# Patient Record
Sex: Female | Born: 1995
Health system: Southern US, Community
[De-identification: ages and names within clinical notes are randomized; demographics above are authoritative.]

## PROBLEM LIST (undated history)

## (undated) ENCOUNTER — Inpatient Hospital Stay (HOSPITAL_COMMUNITY): Payer: Self-pay

## (undated) DIAGNOSIS — O24419 Gestational diabetes mellitus in pregnancy, unspecified control: Secondary | ICD-10-CM

## (undated) HISTORY — DX: Gestational diabetes mellitus in pregnancy, unspecified control: O24.419

---
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
## 2017-03-15 ENCOUNTER — Encounter (HOSPITAL_COMMUNITY)
Admission: AD | Disposition: A | Discharge status: Home / Self Care | Payer: Self-pay | Source: Ambulatory Visit | Attending: Emergency Medicine

## 2017-03-15 ENCOUNTER — Encounter (HOSPITAL_COMMUNITY): Payer: Self-pay | Admitting: *Deleted

## 2017-03-15 ENCOUNTER — Observation Stay (HOSPITAL_COMMUNITY)
Admission: AD | Admit: 2017-03-15 | Discharge: 2017-03-16 | Disposition: A | Discharge status: Home / Self Care | Payer: BLUE CROSS/BLUE SHIELD | Source: Ambulatory Visit | Attending: Obstetrics and Gynecology | Admitting: Obstetrics and Gynecology

## 2017-03-15 ENCOUNTER — Other Ambulatory Visit: Payer: Self-pay

## 2017-03-15 DIAGNOSIS — A419 Sepsis, unspecified organism: Secondary | ICD-10-CM

## 2017-03-15 DIAGNOSIS — D649 Anemia, unspecified: Secondary | ICD-10-CM | POA: Diagnosis present

## 2017-03-15 DIAGNOSIS — Z789 Other specified health status: Secondary | ICD-10-CM | POA: Diagnosis present

## 2017-03-15 DIAGNOSIS — N939 Abnormal uterine and vaginal bleeding, unspecified: Secondary | ICD-10-CM | POA: Diagnosis present

## 2017-03-15 DIAGNOSIS — O039 Complete or unspecified spontaneous abortion without complication: Secondary | ICD-10-CM | POA: Diagnosis present

## 2017-03-15 DIAGNOSIS — O0387 Sepsis following complete or unspecified spontaneous abortion: Secondary | ICD-10-CM

## 2017-03-15 DIAGNOSIS — O99019 Anemia complicating pregnancy, unspecified trimester: Secondary | ICD-10-CM | POA: Diagnosis not present

## 2017-03-15 DIAGNOSIS — Z3A10 10 weeks gestation of pregnancy: Secondary | ICD-10-CM

## 2017-03-15 DIAGNOSIS — Z758 Other problems related to medical facilities and other health care: Secondary | ICD-10-CM | POA: Diagnosis present

## 2017-03-15 HISTORY — DX: Sepsis following complete or unspecified spontaneous abortion: O03.87

## 2017-03-15 HISTORY — PX: DILATION AND CURETTAGE OF UTERUS: SHX78

## 2017-03-15 LAB — CBC
HCT: 12 % — ABNORMAL LOW (ref 36.0–46.0)
Hemoglobin: 3.9 g/dL — CL (ref 12.0–15.0)
MCH: 25.7 pg — ABNORMAL LOW (ref 26.0–34.0)
MCHC: 32.5 g/dL (ref 30.0–36.0)
MCV: 78.9 fL (ref 78.0–100.0)
Platelets: 232 K/uL (ref 150–400)
RBC: 1.52 MIL/uL — ABNORMAL LOW (ref 3.87–5.11)
RDW: 14.6 % (ref 11.5–15.5)
WBC: 10.9 K/uL — ABNORMAL HIGH (ref 4.0–10.5)

## 2017-03-15 LAB — COMPREHENSIVE METABOLIC PANEL WITH GFR
ALT: 7 U/L — ABNORMAL LOW (ref 14–54)
AST: 18 U/L (ref 15–41)
Albumin: 2.9 g/dL — ABNORMAL LOW (ref 3.5–5.0)
Alkaline Phosphatase: 44 U/L (ref 38–126)
Anion gap: 10 (ref 5–15)
BUN: <5 mg/dL — ABNORMAL LOW (ref 6–20)
CO2: 20 mmol/L — ABNORMAL LOW (ref 22–32)
Calcium: 8 mg/dL — ABNORMAL LOW (ref 8.9–10.3)
Chloride: 107 mmol/L (ref 101–111)
Creatinine, Ser: 0.54 mg/dL (ref 0.44–1.00)
GFR calc Af Amer: >60 mL/min
GFR calc non Af Amer: >60 mL/min
Glucose, Bld: 109 mg/dL — ABNORMAL HIGH (ref 65–99)
Potassium: 3.5 mmol/L (ref 3.5–5.1)
Sodium: 137 mmol/L (ref 135–145)
Total Bilirubin: 0.2 mg/dL — ABNORMAL LOW (ref 0.3–1.2)
Total Protein: 5.3 g/dL — ABNORMAL LOW (ref 6.5–8.1)

## 2017-03-15 LAB — PREPARE RBC (CROSSMATCH)

## 2017-03-15 LAB — I-STAT BETA HCG BLOOD, ED (MC, WL, AP ONLY)

## 2017-03-15 LAB — CBG MONITORING, ED: Glucose-Capillary: 104 mg/dL — ABNORMAL HIGH (ref 65–99)

## 2017-03-15 LAB — I-STAT CG4 LACTIC ACID, ED: Lactic Acid, Venous: 2.21 mmol/L (ref 0.5–1.9)

## 2017-03-15 LAB — HCG, QUANTITATIVE, PREGNANCY: hCG, Beta Chain, Quant, S: 14531 m[IU]/mL — ABNORMAL HIGH

## 2017-03-15 LAB — TSH: TSH: 2.187 u[IU]/mL (ref 0.350–4.500)

## 2017-03-15 SURGERY — DILATION AND CURETTAGE
Anesthesia: General

## 2017-03-15 MED ORDER — SODIUM CHLORIDE 0.9 % IV SOLN: 10.0000 mL/h | Freq: Once | INTRAVENOUS | Status: DC

## 2017-03-15 MED ORDER — FENTANYL CITRATE (PF) 100 MCG/2ML IJ SOLN
INTRAMUSCULAR | Status: AC
  Filled 2017-03-15: qty 2

## 2017-03-15 MED ORDER — LABETALOL HCL 5 MG/ML IV SOLN: 10.0000 mg | Freq: Once | INTRAVENOUS | Status: DC

## 2017-03-15 MED ORDER — SODIUM CHLORIDE 0.9 % IV BOLUS (SEPSIS)
1000.0000 mL | Freq: Once | INTRAVENOUS | Status: AC
  Administered 2017-03-15: 1000 mL via INTRAVENOUS

## 2017-03-15 MED ORDER — VANCOMYCIN HCL IN DEXTROSE 1-5 GM/200ML-% IV SOLN
1000.0000 mg | Freq: Once | INTRAVENOUS | Status: AC
  Administered 2017-03-15: 1000 mg via INTRAVENOUS

## 2017-03-15 MED ORDER — ETOMIDATE 2 MG/ML IV SOLN
INTRAVENOUS | Status: AC
  Filled 2017-03-15: qty 10

## 2017-03-15 MED ORDER — ACETAMINOPHEN 325 MG RE SUPP
325.0000 mg | Freq: Once | RECTAL | Status: AC
  Administered 2017-03-15: 325 mg via RECTAL

## 2017-03-15 MED ORDER — MIDAZOLAM HCL 2 MG/2ML IJ SOLN
INTRAMUSCULAR | Status: AC
  Filled 2017-03-15: qty 2

## 2017-03-15 MED ORDER — SODIUM CHLORIDE 0.9 % IV SOLN
100.0000 mg | Freq: Once | INTRAVENOUS | Status: DC
  Filled 2017-03-15: qty 100

## 2017-03-15 MED ORDER — SOD CITRATE-CITRIC ACID 500-334 MG/5ML PO SOLN
30.0000 mL | Freq: Once | ORAL | Status: DC
  Filled 2017-03-15: qty 15

## 2017-03-15 MED ORDER — DIPHENHYDRAMINE HCL 50 MG/ML IJ SOLN
12.5000 mg | Freq: Once | INTRAMUSCULAR | Status: AC
  Administered 2017-03-15: 12.5 mg via INTRAVENOUS
  Filled 2017-03-15: qty 1

## 2017-03-15 MED ORDER — PROCHLORPERAZINE EDISYLATE 5 MG/ML IJ SOLN
10.0000 mg | Freq: Once | INTRAMUSCULAR | Status: AC
  Administered 2017-03-15: 10 mg via INTRAVENOUS
  Filled 2017-03-15: qty 2

## 2017-03-15 MED ORDER — ACETAMINOPHEN 650 MG RE SUPP
650.0000 mg | Freq: Once | RECTAL | Status: AC
Start: 2017-03-15 — End: 2017-03-15
  Administered 2017-03-15: 650 mg via RECTAL

## 2017-03-15 MED ORDER — SOD CITRATE-CITRIC ACID 500-334 MG/5ML PO SOLN
30.0000 mL | Freq: Once | ORAL | Status: AC
  Administered 2017-03-15: 30 mL via ORAL

## 2017-03-15 MED ORDER — PIPERACILLIN-TAZOBACTAM 3.375 G IVPB 30 MIN
3.3750 g | Freq: Once | INTRAVENOUS | Status: AC
  Administered 2017-03-15: 3.375 g via INTRAVENOUS

## 2017-03-15 MED ORDER — SODIUM CHLORIDE 0.9 % IV SOLN
10.0000 mL/h | Freq: Once | INTRAVENOUS | Status: AC
  Administered 2017-03-16: via INTRAVENOUS

## 2017-03-15 MED ORDER — SUCCINYLCHOLINE CHLORIDE 200 MG/10ML IV SOSY
PREFILLED_SYRINGE | INTRAVENOUS | Status: AC
  Filled 2017-03-15: qty 10

## 2017-03-15 SURGICAL SUPPLY — 18 items
CATH ROBINSON RED A/P 16FR (CATHETERS) ×3 IMPLANT
CNTNR SPEC C3OZ STD GRAD LEK (MISCELLANEOUS) ×1 IMPLANT
CONT SPEC 3OZ W/LID STRL (MISCELLANEOUS) ×2
GLOVE BIOGEL PI IND STRL 7.0 (GLOVE) ×1 IMPLANT
GLOVE BIOGEL PI IND STRL 7.5 (GLOVE) ×1 IMPLANT
GLOVE BIOGEL PI INDICATOR 7.0 (GLOVE) ×2
GLOVE BIOGEL PI INDICATOR 7.5 (GLOVE) ×2
GLOVE SURG SS PI 7.0 STRL IVOR (GLOVE) ×3 IMPLANT
GOWN STRL REUS W/TWL LRG LVL3 (GOWN DISPOSABLE) ×3 IMPLANT
GOWN STRL REUS W/TWL XL LVL3 (GOWN DISPOSABLE) ×3 IMPLANT
KIT BERKELEY 1ST TRIMESTER 3/8 (MISCELLANEOUS) ×3 IMPLANT
NS IRRIG 1000ML POUR BTL (IV SOLUTION) ×3 IMPLANT
PACK VAGINAL MINOR WOMEN LF (CUSTOM PROCEDURE TRAY) ×3 IMPLANT
PAD OB MATERNITY 4.3X12.25 (PERSONAL CARE ITEMS) ×3 IMPLANT
PAD PREP 24X48 CUFFED NSTRL (MISCELLANEOUS) ×3 IMPLANT
SET BERKELEY SUCTION TUBING (SUCTIONS) ×3 IMPLANT
TOWEL OR 17X24 6PK STRL BLUE (TOWEL DISPOSABLE) ×6 IMPLANT
VACURETTE 8 RIGID CVD (CANNULA) ×3 IMPLANT

## 2017-03-15 NOTE — ED Provider Notes (Signed)
I saw and evaluated the patient, reviewed the resident's note and I agree with the findings and plan.  Pertinent History: The patient is a 22 year old female who presents with lower abdominal discomfort with heavy vaginal bleeding and now having tachycardia and light headedness - sx are severe, worsened today.  Pertinent Exam findings: The patient is very tachycardic to 135, she is pale, she has pale mucous membranes, she has mild tenderness in the lower abdomen but no guarding.  Per the resident exam there is large amount of vaginal bleeding in the vaginal vault, there is products of conception in the cervix, the patient is unstable appearing  The patient has a critical illness with severe anemia with ongoing vaginal bleeding and needs an emergent OB/GYN consultation.  [facility] with blood transfusion hanging, she has a type and screen, Rh, IV fluid resuscitation, 2 large-bore IVs  Prior to d/c the pt had developed a fever - this is likely from sepsis from the spontaneous abortion with retained products / endometritis  I spoke with Dr. Vergie LivingPickens - who has accepted the pt and states he will prep the OR - the blood has been started as emergency release and will be transfused on a rapid transfuser - broad spectrum antibiotics will be started while pt is being transferred - not to delay transfer any longer as she will need D and C and further rescucitation  2 units given on rapid infuser prior to transfer.  BP improved slightly as did heart rate. Tylenol ordered prior to transfer  Vitals:   03/15/17 2256 03/15/17 2300 03/15/17 2306 03/15/17 2310  BP: (!) 93/38 (!) 99/48 (!) 102/52   Pulse: (!) 140 (!) 132 (!) 139   Resp: (!) 26 (!) 22 (!) 26   Temp:    (!) 102.1 F (38.9 C)  TempSrc:    Oral  SpO2: 100% 100% 98%      .Critical Care Performed by: Eber HongMiller, Clover Feehan, MD Authorized by: Eber HongMiller, Vitaliy Eisenhour, MD   Critical care provider statement:    Critical care time  (minutes):  35   Critical care time was exclusive of:  Separately billable procedures and treating other patients and teaching time   Critical care was necessary to treat or prevent imminent or life-threatening deterioration of the following conditions: hemorrhagic crisis.   Critical care was time spent personally by me on the following activities:  Blood draw for specimens, development of treatment plan with patient or surrogate, discussions with consultants, evaluation of patient's response to treatment, examination of patient, obtaining history from patient or surrogate, ordering and performing treatments and interventions, ordering and review of laboratory studies, ordering and review of radiographic studies, pulse oximetry, re-evaluation of patient's condition and review of old charts     EKG Interpretation  Date/Time:  Monday March 15 2017 19:30:48 EST Ventricular Rate:  154 PR Interval:  134 QRS Duration: 62 QT Interval:  322 QTC Calculation: 515 R Axis:   78 Text Interpretation:  Sinus tachycardia Otherwise normal ECG No old tracing to compare Confirmed by Eber HongMiller, Dewey Neukam ([id number]) on 03/15/2017 11:05:30 PM       I personally interpreted the EKG as well as the resident and agree with the interpretation on the resident's chart.  Final diagnoses:  Severe anemia  Spontaneous abortion  Vaginal hemorrhage  Sepsis, due to unspecified organism [facility](HCC)      Eber HongMiller, Breona Cherubin, MD 03/16/17 [phone]

## 2017-03-15 NOTE — ED Notes (Signed)
Lab stating they are processing labwork

## 2017-03-15 NOTE — ED Notes (Signed)
This RN with another pt in the middle of procedure unable to answer lab phone call but followed up immediately when able to to get lactic results.

## 2017-03-15 NOTE — MAU Note (Signed)
[facility] with vaginal bleeding.

## 2017-03-15 NOTE — H&P (Addendum)
Obstetrics & Gynecology H&P   Date of Admission: 03/15/2017   Primary OBGYN: None  Reason for Admission: septic AB  History of Present Illness: Kristin Jackson is a 22 y.o. G2P1001 (Patient's last menstrual period was 03/14/2017.), with the above CC. Patient transferred from [facility] ED for anemia, VB, septic AB.  Patient received two units PRBC and 1L IVF and transferred to [facility]. Patient unsure of NPO status.   ROS: A 12-point review of systems was performed and negative, except as stated in the above HPI.  OBGYN History: As per HPI. OB History  Gravida Para Term Preterm AB Living  2 1 1  0 0 1  SAB TAB Ectopic Multiple Live Births  0 0 0 0 1    # Outcome Date GA Lbr Len/2nd Weight Sex Delivery Anes PTL Lv  2 Current           1 Term      Vag-Spont          Past Medical History: History reviewed. No pertinent past medical history.  Past Surgical History: History reviewed. No pertinent surgical history.  Family History:  No family history on file.  Social History:  Social History   Socioeconomic History  . Marital status: Unknown    Spouse name: Not on file  . Number of children: Not on file  . Years of education: Not on file  . Highest education level: Not on file  Social Needs  . Financial resource strain: Not on file  . Food insecurity - worry: Not on file  . Food insecurity - inability: Not on file  . Transportation needs - medical: Not on file  . Transportation needs - non-medical: Not on file  Occupational History  . Not on file  Tobacco Use  . Smoking status: Never Smoker  . Smokeless tobacco: Never Used  Substance and Sexual Activity  . Alcohol use: No    Frequency: Never  . Drug use: No  . Sexual activity: Not on file  Other Topics Concern  . Not on file  Social History Narrative  . Not on file    Allergy: No Known Allergies  Current Outpatient Medications: No medications prior to admission.     Hospital Medications: Current Facility-Administered  Medications  Medication Dose Route Frequency Provider Last Rate Last Dose  . 0.9 %  sodium chloride infusion  10 mL/hr Intravenous Once Eber Hong, MD      . 0.9 %  sodium chloride infusion  10 mL/hr Intravenous Once Eber Hong, MD      . sodium citrate-citric acid (ORACIT) solution 30 mL  30 mL Oral Once [place] Bing, MD      . vancomycin (VANCOCIN) IVPB 1000 mg/200 mL premix  1,000 mg Intravenous Once Eber Hong, MD 200 mL/hr at 03/15/17 2306 1,000 mg at 03/15/17 2306     Physical Exam:  Current Vital Signs 24h Vital Sign Ranges  T (!) 102.1 F (38.9 C) Temp  Avg: 101.2 F (38.4 C)  Min: 99.3 F (37.4 C)  Max: 102.1 F (38.9 C)  BP 95/66 BP  Min: 93/38  Max: 122/59  HR (!) 137 Pulse  Avg: 141.7  Min: 132  Max: 165  RR 20 Resp  Avg: 23.7  Min: 14  Max: 33  SaO2 100 % Not Delivered SpO2  Avg: 99.8 %  Min: 98 %  Max: 100 %       24 Hour I/O Current Shift I/O  Time Ins Outs No intake/output data  recorded. 02/25 1901 - 02/26 0700 In: 1000  Out: -    Patient Vitals for the past 24 hrs:  BP Temp Temp src Pulse Resp SpO2  03/15/17 2337 95/66 - - (!) 137 - 100 %  03/15/17 2334 119/66 (!) 102.1 F (38.9 C) - (!) 135 20 100 %  03/15/17 2310 - (!) 102.1 F (38.9 C) Oral - - -  03/15/17 2306 (!) 102/52 - - (!) 139 (!) 26 98 %  03/15/17 2300 (!) 99/48 - - (!) 132 (!) 22 100 %  03/15/17 2256 (!) 93/38 - - (!) 140 (!) 26 100 %  03/15/17 2239 (!) 99/41 - - (!) 140 (!) 28 100 %  03/15/17 2200 103/69 - - (!) 149 (!) 25 100 %  03/15/17 2130 (!) 114/56 - - - 14 -  03/15/17 2128 - - - (!) 137 (!) 21 100 %  03/15/17 2100 (!) 122/59 - - (!) 143 (!) 26 100 %  03/15/17 2030 109/62 - - - (!) 33 -  03/15/17 1926 (!) 109/52 99.3 F (37.4 C) Oral (!) 165 20 100 %    General appearance: Well nourished, well developed female in no acute distress.  Neck:  Supple, normal appearance, and no thyromegaly  Cardiovascular: S1, S2 normal, no murmur, rub or gallop, regular rate and  rhythm Respiratory:  Clear to auscultation bilateral. Normal respiratory effort Abdomen: positive bowel sounds and no masses, hernias; diffusely non tender to palpation, non distended Neuro/Psych:  Normal mood and affect.  Skin:  Warm and dry.  Extremities: no clubbing, cyanosis, or edema.   Pelvic exam in ED showed VB in vault with POCs at the cervix.   Laboratory: Beta HCG: 14531 Recent Labs  Lab 03/15/17 1940  WBC 10.9*  HGB 3.9*  HCT 12.0*  PLT 232   Recent Labs  Lab 03/15/17 1940  NA 137  K 3.5  CL 107  CO2 20*  BUN <5*  CREATININE 0.54  CALCIUM 8.0*  PROT 5.3*  BILITOT 0.2*  ALKPHOS 44  ALT 7*  AST 18  GLUCOSE 109*   No results for input(s): APTT, INR, PTT in the last 168 hours.  Invalid input(s): DRHAPTT Recent Labs  Lab 03/15/17 2242  ABORH O POS  O POS Performed at [facility], [address] [place], [place] [address]     Imaging:  none  Assessment: Kristin Jackson is a 22 y.o. G2P1001 (Patient's last menstrual period was 03/14/2017.) who presented to the ED with complaints of VB, anemia; findings are consistent with septic AB.  Plan: D/w pt and consented for suction D&C Patient received vanc and zosyn at around 2300. Will give pre op doxy NPO. Pt for OR. New T&S ordered  Phone interpreter used.   Cornelia Copaharlie Sekai Gitlin, Jr. MD Attending Center for [facility](Faculty Practice)

## 2017-03-15 NOTE — ED Provider Notes (Signed)
THE [facility] MATERNITY ADMISSIONS Provider Note   CSN: [id number] Arrival date & time: 03/15/17  1901     History   Chief Complaint Chief Complaint  Patient presents with  . Dizziness  . Headache    HPI Kristin Jackson is a 22 y.o. female.  HPI  22 year old female presents emergency department with heavy vaginal bleeding over the past week and a half that is different from her baseline monthly menstrual cycle.  Patient denies any sexual activity or prior pregnancy.  Patient denies any recent illness/trauma.  Patient endorses lightheadedness, sensation of rapid heart rate but otherwise denies any shortness of breath.  Swahili/African interpreter used  History reviewed. No pertinent past medical history.  There are no active problems to display for this patient.   History reviewed. No pertinent surgical history.  OB History    No data available       Home Medications    Prior to Admission medications   Not on File    Family History No family history on file.  Social History Social History   Tobacco Use  . Smoking status: Never Smoker  . Smokeless tobacco: Never Used  Substance Use Topics  . Alcohol use: No    Frequency: Never  . Drug use: No     Allergies   Patient has no known allergies.   Review of Systems Review of Systems  Review of Systems  Constitutional: Negative for fever and chills.  HENT: Negative for ear pain, sore throat and trouble swallowing.   Eyes: Negative for pain and visual disturbance.  Respiratory: Negative for cough and shortness of breath.   Cardiovascular: see hPI Gastrointestinal: Negative for nausea, vomiting, abdominal pain and diarrhea.  Genitourinary: see HPI Musculoskeletal: Negative for back pain and joint swelling.  Skin: Negative for rash and wound.  Neurological: Negative for dizziness, syncope, speech difficulty, weakness and numbness.   Physical Exam Updated Vital Signs BP (!)  102/52   Pulse (!) 139   Temp (!) 102.1 F (38.9 C) (Oral)   Resp (!) 26   LMP 03/14/2017   SpO2 98%   Physical Exam   Physical Exam Vitals:   03/15/17 2306 03/15/17 2310  BP: (!) 102/52   Pulse: (!) 139   Resp: (!) 26   Temp:  (!) 102.1 F (38.9 C)  SpO2: 98%    Constitutional: Patient is in no acute distress Head: Normocephalic and atraumatic.  Eyes: Extraocular motion intact, no scleral icterus Neck: Supple without meningismus, mass, or overt JVD Respiratory: Effort normal and breath sounds normal. No respiratory distress. CV: Tachycardia no obvious murmurs.  Pulses +2 and symmetric Abdomen: Soft, non-tender, non-distended GU: Negative external lesions; vaginal canal with gross blood pooling in the posterior cervix with suspected products of conception within the cervix. MSK: Extremities are atraumatic without deformity, ROM intact Skin: Pale/cool/diaphoretic Neuro: Alert and oriented, no motor deficit noted Psychiatric: Mood and affect are normal.   ED Treatments / Results  Labs (all labs ordered are listed, but only abnormal results are displayed) Labs Reviewed  CBC - Abnormal; Notable for the following components:      Result Value   WBC 10.9 (*)    RBC 1.52 (*)    Hemoglobin 3.9 (*)    HCT 12.0 (*)    MCH 25.7 (*)    All other components within normal limits  HCG, QUANTITATIVE, PREGNANCY - Abnormal; Notable for the following components:   hCG, Beta Chain, Quant, S 14,531 (*)  All other components within normal limits  COMPREHENSIVE METABOLIC PANEL - Abnormal; Notable for the following components:   CO2 20 (*)    Glucose, Bld 109 (*)    BUN <5 (*)    Calcium 8.0 (*)    Total Protein 5.3 (*)    Albumin 2.9 (*)    ALT 7 (*)    Total Bilirubin 0.2 (*)    All other components within normal limits  CBG MONITORING, ED - Abnormal; Notable for the following components:   Glucose-Capillary 104 (*)    All other components within normal limits  I-STAT CG4  LACTIC ACID, ED - Abnormal; Notable for the following components:   Lactic Acid, Venous 2.21 (*)    All other components within normal limits  I-STAT BETA HCG BLOOD, ED (MC, [facility], AP ONLY) - Abnormal; Notable for the following components:   I-stat hCG, quantitative >2,000.0 (*)    All other components within normal limits  CULTURE, BLOOD (ROUTINE X 2)  CULTURE, BLOOD (ROUTINE X 2)  TSH  URINALYSIS, ROUTINE W REFLEX MICROSCOPIC  RAPID URINE DRUG SCREEN, HOSP PERFORMED  PREPARE RBC (CROSSMATCH)  TYPE AND SCREEN  ABO/RH  PREPARE RBC (CROSSMATCH)  TYPE AND SCREEN    EKG  EKG Interpretation  Date/Time:  Monday March 15 2017 19:30:48 EST Ventricular Rate:  154 PR Interval:  134 QRS Duration: 62 QT Interval:  322 QTC Calculation: 515 R Axis:   78 Text Interpretation:  Sinus tachycardia Otherwise normal ECG No old tracing to compare Confirmed by Eber HongMiller, Brian ([id number]) on 03/15/2017 11:05:30 PM       Radiology No results found.  Procedures Procedures (including critical care time)  Medications Ordered in ED Medications  0.9 %  sodium chloride infusion (not administered)  vancomycin (VANCOCIN) IVPB 1000 mg/200 mL premix (1,000 mg Intravenous Transfusing/Transfer 03/15/17 2324)  piperacillin-tazobactam (ZOSYN) IVPB 3.375 g (3.375 g Intravenous Transfusing/Transfer 03/15/17 2324)  0.9 %  sodium chloride infusion (not administered)  sodium citrate-citric acid (ORACIT) solution 30 mL (not administered)  sodium chloride 0.9 % bolus 1,000 mL (0 mLs Intravenous Stopped 03/15/17 2300)  prochlorperazine (COMPAZINE) injection 10 mg (10 mg Intravenous Given 03/15/17 2049)  diphenhydrAMINE (BENADRYL) injection 12.5 mg (12.5 mg Intravenous Given 03/15/17 2050)  acetaminophen (TYLENOL) suppository 650 mg (650 mg Rectal Given 03/15/17 2315)  acetaminophen (TYLENOL) suppository 325 mg (325 mg Rectal Given 03/15/17 2315)     Initial Impression / Assessment and Plan / ED Course  I have reviewed  the triage vital signs and the nursing notes.  Pertinent labs & imaging results that were available during my care of the patient were reviewed by me and considered in my medical decision making (see chart for details).     22 year old female presents emergency department with heavy vaginal bleeding over the past week and a half that is different from her baseline monthly menstrual cycle.  Patient denies any sexual activity or prior pregnancy.  Patient denies any recent illness/trauma.  Patient endorses lightheadedness, sensation of rapid heart rate but otherwise denies any shortness of breath.  Swahili/African interpreter used     upon arrival the patient was tachycardic 160s with systolic within the 100's otherwise no acute distress.  Physical exam as annotated above hCG was positive at 14,000.  Patient is O+.  Hemoglobin was found to be at 3.92 units of emergency O- blood was released and rapidly transfused prior to transfer.  During the course of the emergency department encounter patient became febrile.  Blood cultures  were collected and patient was started empirically on Vanco mycin/Zosyn with concern for septic abortion/endometritis.  Spoke with Dr. Ashok Cordia from [facility] health [facility] for emergency transfer to the operating room.  Patient is stable for transfer.    Final Clinical Impressions(s) / ED Diagnoses   Final diagnoses:  Severe anemia  Spontaneous abortion  Vaginal hemorrhage  Sepsis, due to unspecified organism [facility])    ED Discharge Orders    None       Jaynie Collins, DO 03/15/17 2334    Eber Hong, MD 03/16/17 1409

## 2017-03-15 NOTE — ED Triage Notes (Addendum)
Pt has been having dizziness and frontal headache since Friday. Denies NVD. Tachycardi, appears pale in triage.

## 2017-03-16 ENCOUNTER — Inpatient Hospital Stay (HOSPITAL_COMMUNITY): Payer: BLUE CROSS/BLUE SHIELD | Admitting: Anesthesiology

## 2017-03-16 ENCOUNTER — Encounter (HOSPITAL_COMMUNITY): Payer: Self-pay | Admitting: Obstetrics and Gynecology

## 2017-03-16 ENCOUNTER — Inpatient Hospital Stay (HOSPITAL_COMMUNITY): Payer: BLUE CROSS/BLUE SHIELD

## 2017-03-16 ENCOUNTER — Other Ambulatory Visit: Payer: Self-pay

## 2017-03-16 DIAGNOSIS — O0387 Sepsis following complete or unspecified spontaneous abortion: Secondary | ICD-10-CM | POA: Diagnosis not present

## 2017-03-16 LAB — CBC WITH DIFFERENTIAL/PLATELET
BASOS PCT: 0 %
Basophils Absolute: 0 10*3/uL (ref 0.0–0.1)
Eosinophils Absolute: 0 10*3/uL (ref 0.0–0.7)
Eosinophils Relative: 0 %
HEMATOCRIT: 28.2 % — AB (ref 36.0–46.0)
Hemoglobin: 10 g/dL — ABNORMAL LOW (ref 12.0–15.0)
LYMPHS ABS: 0.8 10*3/uL (ref 0.7–4.0)
LYMPHS PCT: 7 %
MCH: 30.3 pg (ref 26.0–34.0)
MCHC: 35.5 g/dL (ref 30.0–36.0)
MCV: 85.5 fL (ref 78.0–100.0)
MONO ABS: 0.1 10*3/uL (ref 0.1–1.0)
MONOS PCT: 1 %
NEUTROS ABS: 12 10*3/uL — AB (ref 1.7–7.7)
NEUTROS PCT: 92 %
Platelets: 157 10*3/uL (ref 150–400)
RBC: 3.3 MIL/uL — ABNORMAL LOW (ref 3.87–5.11)
RDW: 16 % — AB (ref 11.5–15.5)
WBC: 13 10*3/uL — ABNORMAL HIGH (ref 4.0–10.5)

## 2017-03-16 LAB — TYPE AND SCREEN
ABO/RH(D): O POS
ANTIBODY SCREEN: NEGATIVE
UNIT DIVISION: 0
Unit division: 0
Unit division: 0
Unit division: 0

## 2017-03-16 LAB — COMPREHENSIVE METABOLIC PANEL
ALBUMIN: 2.6 g/dL — AB (ref 3.5–5.0)
ALK PHOS: 44 U/L (ref 38–126)
ALT: 10 U/L — ABNORMAL LOW (ref 14–54)
ANION GAP: 6 (ref 5–15)
AST: 26 U/L (ref 15–41)
BUN: <5 mg/dL — ABNORMAL LOW (ref 6–20)
CHLORIDE: 112 mmol/L — AB (ref 101–111)
CO2: 20 mmol/L — AB (ref 22–32)
Calcium: 6.6 mg/dL — ABNORMAL LOW (ref 8.9–10.3)
Creatinine, Ser: 0.41 mg/dL — ABNORMAL LOW (ref 0.44–1.00)
GFR calc non Af Amer: >60 mL/min (ref >60)
GLUCOSE: 136 mg/dL — AB (ref 65–99)
POTASSIUM: 3.7 mmol/L (ref 3.5–5.1)
SODIUM: 138 mmol/L (ref 135–145)
Total Bilirubin: 0.9 mg/dL (ref 0.3–1.2)
Total Protein: 5.3 g/dL — ABNORMAL LOW (ref 6.5–8.1)

## 2017-03-16 LAB — BPAM RBC
BLOOD PRODUCT EXPIRATION DATE: 201903152359
BLOOD PRODUCT EXPIRATION DATE: 201903272359
Blood Product Expiration Date: 201903152359
Blood Product Expiration Date: 201903182359
ISSUE DATE / TIME: 201902252240
ISSUE DATE / TIME: 201902252240
UNIT TYPE AND RH: 9500
Unit Type and Rh: 5100
Unit Type and Rh: 5100
Unit Type and Rh: 9500

## 2017-03-16 LAB — POCT I-STAT, CHEM 8
BUN: <3 mg/dL — ABNORMAL LOW (ref 6–20)
CREATININE: 0.4 mg/dL — AB (ref 0.44–1.00)
Calcium, Ion: 1.02 mmol/L — ABNORMAL LOW (ref 1.15–1.40)
Chloride: 108 mmol/L (ref 101–111)
GLUCOSE: 124 mg/dL — AB (ref 65–99)
HEMATOCRIT: 17 % — AB (ref 36.0–46.0)
HEMOGLOBIN: 5.8 g/dL — AB (ref 12.0–15.0)
POTASSIUM: 4.5 mmol/L (ref 3.5–5.1)
Sodium: 141 mmol/L (ref 135–145)
TCO2: 19 mmol/L — AB (ref 22–32)

## 2017-03-16 LAB — ABO/RH
ABO/RH(D): O POS
ABO/RH(D): O POS

## 2017-03-16 LAB — PREPARE RBC (CROSSMATCH)

## 2017-03-16 MED ORDER — POLYETHYLENE GLYCOL 3350 17 G PO PACK: 17.0000 g | PACK | Freq: Every day | ORAL | Status: DC | PRN

## 2017-03-16 MED ORDER — IBUPROFEN 600 MG PO TABS: 600.0000 mg | ORAL_TABLET | Freq: Four times a day (QID) | ORAL | 0 refills | Status: DC | PRN

## 2017-03-16 MED ORDER — DEXAMETHASONE SODIUM PHOSPHATE 4 MG/ML IJ SOLN
INTRAMUSCULAR | Status: DC | PRN
  Administered 2017-03-16: 4 mg via INTRAVENOUS

## 2017-03-16 MED ORDER — SCOPOLAMINE 1 MG/3DAYS TD PT72
MEDICATED_PATCH | TRANSDERMAL | Status: DC | PRN
  Administered 2017-03-16: 1 via TRANSDERMAL

## 2017-03-16 MED ORDER — IBUPROFEN 600 MG PO TABS: 600.0000 mg | ORAL_TABLET | Freq: Four times a day (QID) | ORAL | Status: DC | PRN

## 2017-03-16 MED ORDER — MIDAZOLAM HCL 2 MG/2ML IJ SOLN
INTRAMUSCULAR | Status: DC | PRN
  Administered 2017-03-16: 2 mg via INTRAVENOUS

## 2017-03-16 MED ORDER — FERROUS GLUCONATE 324 (38 FE) MG PO TABS: 324.0000 mg | ORAL_TABLET | Freq: Two times a day (BID) | ORAL | 3 refills | Status: DC

## 2017-03-16 MED ORDER — LACTATED RINGERS IV SOLN
INTRAVENOUS | Status: DC | PRN
  Administered 2017-03-16: via INTRAVENOUS

## 2017-03-16 MED ORDER — SODIUM CHLORIDE 0.9 % IV SOLN
INTRAVENOUS | Status: DC | PRN
  Administered 2017-03-16: 100 mg via INTRAVENOUS

## 2017-03-16 MED ORDER — DEXAMETHASONE SODIUM PHOSPHATE 4 MG/ML IJ SOLN
INTRAMUSCULAR | Status: AC
  Filled 2017-03-16: qty 1

## 2017-03-16 MED ORDER — SODIUM CHLORIDE 0.9 % IV SOLN
INTRAVENOUS | Status: DC
  Administered 2017-03-16: 06:00:00 via INTRAVENOUS

## 2017-03-16 MED ORDER — SCOPOLAMINE 1 MG/3DAYS TD PT72
MEDICATED_PATCH | TRANSDERMAL | Status: AC
  Filled 2017-03-16: qty 1

## 2017-03-16 MED ORDER — ETOMIDATE 2 MG/ML IV SOLN
INTRAVENOUS | Status: DC | PRN
  Administered 2017-03-16: 10 mg via INTRAVENOUS

## 2017-03-16 MED ORDER — PROMETHAZINE HCL 25 MG/ML IJ SOLN: 6.2500 mg | INTRAMUSCULAR | Status: DC | PRN

## 2017-03-16 MED ORDER — ROCURONIUM BROMIDE 100 MG/10ML IV SOLN
INTRAVENOUS | Status: DC | PRN
  Administered 2017-03-16: 30 mg via INTRAVENOUS

## 2017-03-16 MED ORDER — SUCCINYLCHOLINE CHLORIDE 20 MG/ML IJ SOLN
INTRAMUSCULAR | Status: DC | PRN
  Administered 2017-03-16: 100 mg via INTRAVENOUS

## 2017-03-16 MED ORDER — ONDANSETRON HCL 4 MG/2ML IJ SOLN: 4.0000 mg | Freq: Four times a day (QID) | INTRAMUSCULAR | Status: DC | PRN

## 2017-03-16 MED ORDER — ONDANSETRON HCL 4 MG PO TABS: 4.0000 mg | ORAL_TABLET | Freq: Four times a day (QID) | ORAL | Status: DC | PRN

## 2017-03-16 MED ORDER — LACTATED RINGERS IV SOLN
INTRAVENOUS | Status: DC
  Administered 2017-03-16: 04:00:00 via INTRAVENOUS

## 2017-03-16 MED ORDER — DOXYCYCLINE HYCLATE 100 MG PO CAPS: 100.0000 mg | ORAL_CAPSULE | Freq: Two times a day (BID) | ORAL | 0 refills | Status: DC

## 2017-03-16 MED ORDER — OXYCODONE HCL 5 MG PO TABS: 5.0000 mg | ORAL_TABLET | Freq: Four times a day (QID) | ORAL | Status: DC | PRN

## 2017-03-16 MED ORDER — FENTANYL CITRATE (PF) 100 MCG/2ML IJ SOLN
INTRAMUSCULAR | Status: DC | PRN
  Administered 2017-03-16: 50 ug via INTRAVENOUS

## 2017-03-16 MED ORDER — SUGAMMADEX SODIUM 200 MG/2ML IV SOLN
INTRAVENOUS | Status: AC
  Filled 2017-03-16: qty 2

## 2017-03-16 MED ORDER — HYDROMORPHONE HCL 1 MG/ML IJ SOLN: 0.2500 mg | INTRAMUSCULAR | Status: DC | PRN

## 2017-03-16 MED ORDER — FERROUS GLUCONATE 324 (38 FE) MG PO TABS: 324.0000 mg | ORAL_TABLET | Freq: Two times a day (BID) | ORAL | Status: DC

## 2017-03-16 MED ORDER — SODIUM CHLORIDE 0.9 % IV SOLN
Freq: Once | INTRAVENOUS | Status: AC
  Administered 2017-03-16: 01:00:00 via INTRAVENOUS

## 2017-03-16 MED ORDER — SUGAMMADEX SODIUM 200 MG/2ML IV SOLN
INTRAVENOUS | Status: DC | PRN
  Administered 2017-03-16: 200 mg via INTRAVENOUS

## 2017-03-16 MED ORDER — ONDANSETRON HCL 4 MG/2ML IJ SOLN
INTRAMUSCULAR | Status: DC | PRN
  Administered 2017-03-16: 4 mg via INTRAVENOUS

## 2017-03-16 NOTE — Anesthesia Postprocedure Evaluation (Signed)
Anesthesia Post Note  Patient: Kristin Jackson  Procedure(s) Performed: DILATATION AND CURETTAGE (Suction D & C with Ultrasound) (N/A )     Patient location during evaluation: PACU Anesthesia Type: General Level of consciousness: awake and alert Pain management: pain level controlled Vital Signs Assessment: post-procedure vital signs reviewed and stable Respiratory status: spontaneous breathing, nonlabored ventilation, respiratory function stable and patient connected to nasal cannula oxygen Cardiovascular status: blood pressure returned to baseline and stable Postop Assessment: no apparent nausea or vomiting Anesthetic complications: no    Last Vitals:  Vitals:   03/16/17 0433 03/16/17 0500  BP: (!) 109/58   Pulse: (!) 117   Resp: 18   Temp: 37.3 C   SpO2: 100% 100%    Last Pain:  Vitals:   03/16/17 0500  TempSrc:   PainSc: 0-No pain   Pain Goal:                 Kennieth RadFitzgerald, Yaziel Brandon E

## 2017-03-16 NOTE — Anesthesia Preprocedure Evaluation (Signed)
Anesthesia Evaluation  Patient identified by MRN, date of birth, ID band Patient awake    Reviewed: Allergy & Precautions, NPO status , Patient's Chart, lab work & pertinent test resultsPreop documentation limited or incomplete due to emergent nature of procedure.  Airway Mallampati: II  TM Distance: >3 FB Neck ROM: Full    Dental  (+) Dental Advisory Given   Pulmonary neg pulmonary ROS,    breath sounds clear to auscultation       Cardiovascular negative cardio ROS   Rhythm:Regular Rate:Normal     Neuro/Psych negative neurological ROS     GI/Hepatic negative GI ROS, Neg liver ROS,   Endo/Other  negative endocrine ROS  Renal/GU negative Renal ROS     Musculoskeletal   Abdominal   Peds  Hematology  (+) anemia , Acute blood loss anemia   Anesthesia Other Findings   Reproductive/Obstetrics                             Lab Results  Component Value Date   WBC 10.9 (H) 03/15/2017   HGB 3.9 (LL) 03/15/2017   HCT 12.0 (L) 03/15/2017   MCV 78.9 03/15/2017   PLT 232 03/15/2017   Lab Results  Component Value Date   CREATININE 0.54 03/15/2017   BUN <5 (L) 03/15/2017   NA 137 03/15/2017   K 3.5 03/15/2017   CL 107 03/15/2017   CO2 20 (L) 03/15/2017    Anesthesia Physical Anesthesia Plan  ASA: IV and emergent  Anesthesia Plan: General   Post-op Pain Management:    Induction: Intravenous and Rapid sequence  PONV Risk Score and Plan: 3 and Ondansetron, Dexamethasone, Midazolam and Treatment may vary due to age or medical condition  Airway Management Planned: Oral ETT  Additional Equipment:   Intra-op Plan:   Post-operative Plan: Extubation in OR and Possible Post-op intubation/ventilation  Informed Consent: I have reviewed the patients History and Physical, chart, labs and discussed the procedure including the risks, benefits and alternatives for the proposed anesthesia with the  patient or authorized representative who has indicated his/her understanding and acceptance.   Dental advisory given  Plan Discussed with: CRNA  Anesthesia Plan Comments:         Anesthesia Quick Evaluation

## 2017-03-16 NOTE — Transfer of Care (Signed)
Immediate Anesthesia Transfer of Care Note  Patient: Kristin Jackson  Procedure(s) Performed: DILATATION AND CURETTAGE (Suction D & C with Ultrasound) (N/A )  Patient Location: PACU  Anesthesia Type:General  Level of Consciousness: awake  Airway & Oxygen Therapy: Patient Spontanous Breathing and Patient connected to nasal cannula oxygen  Post-op Assessment: Report given to RN and Post -op Vital signs reviewed and stable  Post vital signs: Reviewed and stable  Last Vitals:  Vitals:   03/15/17 2334 03/15/17 2337  BP: 119/66 95/66  Pulse: (!) 135 (!) 137  Resp: 20   Temp: (!) 38.9 C   SpO2: 100% 100%    Last Pain:  Vitals:   03/15/17 2354  TempSrc:   PainSc: 0-No pain         Complications: No apparent anesthesia complications

## 2017-03-16 NOTE — Discharge Instructions (Signed)

## 2017-03-16 NOTE — Progress Notes (Addendum)
Ipad interpreter uses for assessment purpose. Pt reports no problems at this time. Pt also able to give her breakfast order through interpreter. Carmelina DaneERRI L Orris Perin, RN   Interpreter ID [id number]

## 2017-03-16 NOTE — Discharge Summary (Signed)
Physician Discharge Summary  Patient ID: Kristin Jackson MRN: [id number] DOB/AGE: 21/01/1995 21 y.o.  Admit date: 03/15/2017 Discharge date: 03/16/2017  Admission Diagnoses: septic AB  Discharge Diagnoses:  Active Problems:   Language barrier   Spontaneous abortion with septicemia   Discharged Condition: good  Hospital Course: Pt was admitted with septic AB. She is s/p D&E. Patient had an uncomplicated surgery; for further details of this surgery, please refer to the operative note. Furthermore, the patient had an uncomplicated postoperative course.  By time of discharge, her pain was controlled on oral pain medications; she was ambulating, voiding without difficulty, tolerating regular diet and passing flatus.  She was deemed stable for discharge to home.    Significant Diagnostic Studies: labs: CBC and radiology: Ultrasound: pelvis  Treatments: surgery: D&C  Discharge Exam: Blood pressure 113/67, pulse (!) 104, temperature 98.6 F (37 C), temperature source Oral, resp. rate 18, last menstrual period 03/14/2017, SpO2 100 %. General appearance: alert and no distress GI: soft, non-tender; bowel sounds normal; no masses,  no organomegaly Extremities: extremities normal, atraumatic, no cyanosis or edema  CBC    Component Value Date/Time   WBC 13.0 (H) 03/16/2017 0705   RBC 3.30 (L) 03/16/2017 0705   HGB 10.0 (L) 03/16/2017 0705   HCT 28.2 (L) 03/16/2017 0705   PLT 157 03/16/2017 0705   MCV 85.5 03/16/2017 0705   MCH 30.3 03/16/2017 0705   MCHC 35.5 03/16/2017 0705   RDW 16.0 (H) 03/16/2017 0705   LYMPHSABS 0.8 03/16/2017 0705   MONOABS 0.1 03/16/2017 0705   EOSABS 0.0 03/16/2017 0705   BASOSABS 0.0 03/16/2017 0705    Disposition: Final discharge disposition not confirmed  Discharge Instructions    Call MD for:  difficulty breathing, headache or visual disturbances   Complete by:  As directed    Call MD for:  persistant dizziness or light-headedness   Complete by:  As  directed    Call MD for:  persistant nausea and vomiting   Complete by:  As directed    Call MD for:  redness, tenderness, or signs of infection (pain, swelling, redness, odor or green/yellow discharge around incision site)   Complete by:  As directed    Call MD for:  severe uncontrolled pain   Complete by:  As directed    Call MD for:  temperature >100.4   Complete by:  As directed    Diet - low sodium heart healthy   Complete by:  As directed    Increase activity slowly   Complete by:  As directed      Allergies as of 03/16/2017   No Known Allergies     Medication List    TAKE these medications   doxycycline 100 MG capsule Commonly known as:  VIBRAMYCIN Take 1 capsule (100 mg total) by mouth 2 (two) times daily.   ferrous gluconate 324 MG tablet Commonly known as:  FERGON Take 1 tablet (324 mg total) by mouth 2 (two) times daily with a meal. Start taking on:  03/17/2017   ibuprofen 600 MG tablet Commonly known as:  ADVIL,MOTRIN Take 1 tablet (600 mg total) by mouth every 6 (six) hours as needed (mild pain).      Follow-up Information    Theodore BingPickens, Charlie, MD Follow up in 2 week(s).   Specialty:  Obstetrics and Gynecology Contact information: [address] [place] [address] [phone]           Signed: Willodean RosenthalCarolyn Harraway-Smith 03/16/2017, 11:02 AM

## 2017-03-16 NOTE — Op Note (Signed)
Operative Note   03/16/2017  PRE-OP DIAGNOSIS: Septic abortion. Retained products of conception   POST-OP DIAGNOSIS: Same  SURGEON: Surgeon(s) and Role:    * Wauseon BingPickens, Haiden Clucas, MD - Primary  ASSISTANT: None  PROCEDURE:  Suction dilation and curettage with ultrasound guidance  ANESTHESIA: Monitor Anesthesia Care   ESTIMATED BLOOD LOSS: 100mL  DRAINS: indwelling bladder foley placed at end of case  TOTAL IV FLUIDS: per anesthesia note  SPECIMENS: products of conception to pathology  VTE PROPHYLAXIS: SCDs to the bilateral lower extremities  ANTIBIOTICS: Doxycycline 100mg  IV x 1 given intra operatively after vancomycin  COMPLICATIONS: none  DISPOSITION: PACU - hemodynamically stable.  CONDITION: stable  BLOOD TYPE: O POS. Rhogam given:not applicable  FINDINGS: Exam under anesthesia revealed 8-10 week sized uterus with no masses and bilateral adnexa without masses or fullness. Moderate amounts of products of conception were seen, with gritty texture in all four quadrants at the end of the case and noted on ultrasound: endometrial stripe 9mm and no blood flow.   PROCEDURE IN DETAIL:  After informed consent was obtained, the patient was taken to the operating room where anesthesia was obtained without difficulty. The patient was positioned in the dorsal lithotomy position in [place] stirrups. The patient was examined under anesthesia, with the above noted findings.  The bi-valved speculum was placed inside the patient's vagina, and the the anterior lip of the cervix was seen and grasped with the tenaculum.  The suction was then calibrated to 60mmHg and connected to the number 8 cannula, which was then introduced with the above noted findings. A gentle curettage was done at the end and yield no products of conception.   A bimanual massage was done, and then the suction was then done one more time to remove any remaining curettage material.  Ultrasound findings as noted above.  Excellent  hemostasis was noted, and all instruments were removed, with excellent hemostasis noted throughout.  She was then taken out of dorsal lithotomy.  The patient tolerated the procedure well.  Sponge, lap and instrument counts were correct x2.  The patient was taken to recovery room in excellent condition.  Will obtain CBC with differential after 2nd unit of additional blood (for four units total) is given and hold off on further antibiotics pending patient's clinical status.   Cornelia Copaharlie Linnet Bottari, Jr MD Attending Center for [facility](Faculty Practice)

## 2017-03-16 NOTE — Anesthesia Procedure Notes (Signed)
Procedure Name: Intubation Date/Time: 03/16/2017 12:12 AM Performed by: Purvis Kilts, CRNA Pre-anesthesia Checklist: Patient identified, Emergency Drugs available, Suction available, Patient being monitored and Timeout performed Patient Re-evaluated:Patient Re-evaluated prior to induction Oxygen Delivery Method: Circle system utilized Preoxygenation: Pre-oxygenation with 100% oxygen Induction Type: IV induction and Rapid sequence Laryngoscope Size: Mac and 3 Grade View: Grade I Tube type: Oral Tube size: 7.0 mm Number of attempts: 1 Airway Equipment and Method: Stylet Placement Confirmation: ETT inserted through vocal cords under direct vision,  positive ETCO2 and breath sounds checked- equal and bilateral Secured at: 20 cm Tube secured with: Tape Dental Injury: Teeth and Oropharynx as per pre-operative assessment

## 2017-03-17 LAB — TYPE AND SCREEN
ABO/RH(D): O POS
Antibody Screen: NEGATIVE
Unit division: 0
Unit division: 0

## 2017-03-17 LAB — BPAM RBC
Blood Product Expiration Date: 201903122359
Blood Product Expiration Date: 201903122359
ISSUE DATE / TIME: 201902260113
ISSUE DATE / TIME: 201902260113
Unit Type and Rh: 5100
Unit Type and Rh: 5100

## 2017-03-19 NOTE — Addendum Note (Signed)
Addendum  created 03/19/17 1814 by Donney DiceKelly, Viveca Beckstrom D, CRNA   Charge Capture section accepted

## 2017-04-21 ENCOUNTER — Ambulatory Visit: Payer: BLUE CROSS/BLUE SHIELD | Admitting: Obstetrics and Gynecology

## 2017-04-21 ENCOUNTER — Encounter: Payer: Self-pay | Admitting: General Practice

## 2017-04-21 NOTE — Progress Notes (Signed)
Patient no showed for appt today. Per Dr Pickens, patient can reschedule appt on her own. 

## 2018-03-14 ENCOUNTER — Encounter (HOSPITAL_COMMUNITY): Payer: Self-pay

## 2019-01-20 NOTE — L&D Delivery Note (Signed)
Delivery Note At 7:41 PM a viable female was delivered via Vaginal, Spontaneous (Presentation: Left Occiput Anterior).  APGAR: 9, 9; weight pending .   Placenta status: Spontaneous, Intact.  Cord: 3 vessels with the following complications: None.  Cord pH: NA  Anesthesia: None Episiotomy:   Lacerations: 1st degree right vaginal side side wall;Periurethral Suture Repair: 3.0 Est. Blood Loss (mL): 217  Mom to postpartum.  Baby to Couplet care / Skin to Skin.  Kristin Jackson 05/07/2019, 8:28 PM

## 2019-03-31 ENCOUNTER — Other Ambulatory Visit (HOSPITAL_COMMUNITY): Payer: Self-pay | Admitting: Nurse Practitioner

## 2019-03-31 DIAGNOSIS — Z363 Encounter for antenatal screening for malformations: Secondary | ICD-10-CM

## 2019-03-31 DIAGNOSIS — Z3A22 22 weeks gestation of pregnancy: Secondary | ICD-10-CM

## 2019-04-03 ENCOUNTER — Encounter (HOSPITAL_COMMUNITY): Payer: Self-pay | Admitting: *Deleted

## 2019-04-06 ENCOUNTER — Ambulatory Visit (HOSPITAL_COMMUNITY): Admission: RE | Admit: 2019-04-06 | Payer: BC Managed Care – PPO | Source: Ambulatory Visit

## 2019-04-12 ENCOUNTER — Other Ambulatory Visit (HOSPITAL_COMMUNITY): Payer: Self-pay | Admitting: Nurse Practitioner

## 2019-04-12 ENCOUNTER — Ambulatory Visit (HOSPITAL_COMMUNITY)
Admission: RE | Admit: 2019-04-12 | Discharge: 2019-04-12 | Disposition: A | Discharge status: Home / Self Care | Payer: BC Managed Care – PPO | Source: Ambulatory Visit | Attending: Obstetrics and Gynecology | Admitting: Obstetrics and Gynecology

## 2019-04-12 ENCOUNTER — Other Ambulatory Visit: Payer: Self-pay

## 2019-04-12 ENCOUNTER — Other Ambulatory Visit (HOSPITAL_COMMUNITY): Payer: Self-pay | Admitting: *Deleted

## 2019-04-12 DIAGNOSIS — Z3687 Encounter for antenatal screening for uncertain dates: Secondary | ICD-10-CM | POA: Diagnosis not present

## 2019-04-12 DIAGNOSIS — O0933 Supervision of pregnancy with insufficient antenatal care, third trimester: Secondary | ICD-10-CM

## 2019-04-12 DIAGNOSIS — Z363 Encounter for antenatal screening for malformations: Secondary | ICD-10-CM

## 2019-04-12 DIAGNOSIS — Z362 Encounter for other antenatal screening follow-up: Secondary | ICD-10-CM

## 2019-04-12 DIAGNOSIS — Z3A22 22 weeks gestation of pregnancy: Secondary | ICD-10-CM

## 2019-05-07 ENCOUNTER — Encounter (HOSPITAL_COMMUNITY): Payer: Self-pay | Admitting: Obstetrics and Gynecology

## 2019-05-07 ENCOUNTER — Inpatient Hospital Stay (HOSPITAL_COMMUNITY)
Admission: AD | Admit: 2019-05-07 | Discharge: 2019-05-09 | DRG: 807 | Disposition: A | Discharge status: Home / Self Care | Payer: BC Managed Care – PPO | Attending: Obstetrics and Gynecology | Admitting: Obstetrics and Gynecology

## 2019-05-07 ENCOUNTER — Other Ambulatory Visit: Payer: Self-pay

## 2019-05-07 DIAGNOSIS — O26893 Other specified pregnancy related conditions, third trimester: Secondary | ICD-10-CM | POA: Diagnosis present

## 2019-05-07 DIAGNOSIS — Z3A38 38 weeks gestation of pregnancy: Secondary | ICD-10-CM

## 2019-05-07 DIAGNOSIS — Z20822 Contact with and (suspected) exposure to covid-19: Secondary | ICD-10-CM | POA: Diagnosis present

## 2019-05-07 DIAGNOSIS — O1404 Mild to moderate pre-eclampsia, complicating childbirth: Secondary | ICD-10-CM | POA: Diagnosis present

## 2019-05-07 DIAGNOSIS — Z789 Other specified health status: Secondary | ICD-10-CM | POA: Diagnosis present

## 2019-05-07 DIAGNOSIS — O1403 Mild to moderate pre-eclampsia, third trimester: Secondary | ICD-10-CM | POA: Diagnosis present

## 2019-05-07 DIAGNOSIS — Z758 Other problems related to medical facilities and other health care: Secondary | ICD-10-CM | POA: Diagnosis present

## 2019-05-07 DIAGNOSIS — Z603 Acculturation difficulty: Secondary | ICD-10-CM | POA: Diagnosis not present

## 2019-05-07 LAB — COMPREHENSIVE METABOLIC PANEL
ALT: 26 U/L (ref 0–44)
AST: 39 U/L (ref 15–41)
Albumin: 2.9 g/dL — ABNORMAL LOW (ref 3.5–5.0)
Alkaline Phosphatase: 85 U/L (ref 38–126)
Anion gap: 11 (ref 5–15)
BUN: <5 mg/dL — ABNORMAL LOW (ref 6–20)
CO2: 21 mmol/L — ABNORMAL LOW (ref 22–32)
Calcium: 8.3 mg/dL — ABNORMAL LOW (ref 8.9–10.3)
Chloride: 108 mmol/L (ref 98–111)
Creatinine, Ser: 0.32 mg/dL — ABNORMAL LOW (ref 0.44–1.00)
GFR calc Af Amer: >60 mL/min (ref >60)
GFR calc non Af Amer: >60 mL/min (ref >60)
Glucose, Bld: 66 mg/dL — ABNORMAL LOW (ref 70–99)
Potassium: 3 mmol/L — ABNORMAL LOW (ref 3.5–5.1)
Sodium: 140 mmol/L (ref 135–145)
Total Bilirubin: 0.5 mg/dL (ref 0.3–1.2)
Total Protein: 5.8 g/dL — ABNORMAL LOW (ref 6.5–8.1)

## 2019-05-07 LAB — PROTEIN / CREATININE RATIO, URINE
Creatinine, Urine: 221.46 mg/dL
Protein Creatinine Ratio: 0.4 mg/mg{Cre} — ABNORMAL HIGH (ref 0.00–0.15)
Total Protein, Urine: 88 mg/dL

## 2019-05-07 LAB — CBC
HCT: 32.2 % — ABNORMAL LOW (ref 36.0–46.0)
Hemoglobin: 10.5 g/dL — ABNORMAL LOW (ref 12.0–15.0)
MCH: 27.3 pg (ref 26.0–34.0)
MCHC: 32.6 g/dL (ref 30.0–36.0)
MCV: 83.6 fL (ref 80.0–100.0)
Platelets: 183 10*3/uL (ref 150–400)
RBC: 3.85 MIL/uL — ABNORMAL LOW (ref 3.87–5.11)
RDW: 14 % (ref 11.5–15.5)
WBC: 8 10*3/uL (ref 4.0–10.5)
nRBC: 0 % (ref 0.0–0.2)

## 2019-05-07 LAB — RESPIRATORY PANEL BY RT PCR (FLU A&B, COVID)
Influenza A by PCR: NEGATIVE
Influenza B by PCR: NEGATIVE
SARS Coronavirus 2 by RT PCR: NEGATIVE

## 2019-05-07 LAB — TYPE AND SCREEN
ABO/RH(D): O POS
Antibody Screen: NEGATIVE

## 2019-05-07 MED ORDER — DIBUCAINE (PERIANAL) 1 % EX OINT: 1.0000 "application " | TOPICAL_OINTMENT | CUTANEOUS | Status: DC | PRN

## 2019-05-07 MED ORDER — WITCH HAZEL-GLYCERIN EX PADS: 1.0000 "application " | MEDICATED_PAD | CUTANEOUS | Status: DC | PRN

## 2019-05-07 MED ORDER — ACETAMINOPHEN 325 MG PO TABS: 650.0000 mg | ORAL_TABLET | ORAL | Status: DC | PRN

## 2019-05-07 MED ORDER — TETANUS-DIPHTH-ACELL PERTUSSIS 5-2.5-18.5 LF-MCG/0.5 IM SUSP: 0.5000 mL | Freq: Once | INTRAMUSCULAR | Status: DC

## 2019-05-07 MED ORDER — LACTATED RINGERS IV SOLN: 500.0000 mL | INTRAVENOUS | Status: DC | PRN

## 2019-05-07 MED ORDER — OXYCODONE-ACETAMINOPHEN 5-325 MG PO TABS: 1.0000 | ORAL_TABLET | ORAL | Status: DC | PRN

## 2019-05-07 MED ORDER — LACTATED RINGERS IV SOLN: INTRAVENOUS | Status: DC

## 2019-05-07 MED ORDER — ONDANSETRON HCL 4 MG PO TABS: 4.0000 mg | ORAL_TABLET | ORAL | Status: DC | PRN

## 2019-05-07 MED ORDER — SIMETHICONE 80 MG PO CHEW
80.0000 mg | CHEWABLE_TABLET | ORAL | Status: DC | PRN
  Administered 2019-05-09: 80 mg via ORAL
  Filled 2019-05-07: qty 1

## 2019-05-07 MED ORDER — SOD CITRATE-CITRIC ACID 500-334 MG/5ML PO SOLN: 30.0000 mL | ORAL | Status: DC | PRN

## 2019-05-07 MED ORDER — ONDANSETRON HCL 4 MG/2ML IJ SOLN
4.0000 mg | Freq: Four times a day (QID) | INTRAMUSCULAR | Status: DC | PRN
  Administered 2019-05-07: 4 mg via INTRAVENOUS
  Filled 2019-05-07: qty 2

## 2019-05-07 MED ORDER — DIPHENHYDRAMINE HCL 25 MG PO CAPS: 25.0000 mg | ORAL_CAPSULE | Freq: Four times a day (QID) | ORAL | Status: DC | PRN

## 2019-05-07 MED ORDER — PRENATAL MULTIVITAMIN CH
1.0000 | ORAL_TABLET | Freq: Every day | ORAL | Status: DC
  Administered 2019-05-08 – 2019-05-09 (×2): 1 via ORAL
  Filled 2019-05-07 (×2): qty 1

## 2019-05-07 MED ORDER — BENZOCAINE-MENTHOL 20-0.5 % EX AERO: 1.0000 "application " | INHALATION_SPRAY | CUTANEOUS | Status: DC | PRN

## 2019-05-07 MED ORDER — OXYTOCIN BOLUS FROM INFUSION
500.0000 mL | Freq: Once | INTRAVENOUS | Status: AC
  Administered 2019-05-07: 500 mL via INTRAVENOUS

## 2019-05-07 MED ORDER — ONDANSETRON HCL 4 MG/2ML IJ SOLN: 4.0000 mg | INTRAMUSCULAR | Status: DC | PRN

## 2019-05-07 MED ORDER — LIDOCAINE HCL (PF) 1 % IJ SOLN
30.0000 mL | INTRAMUSCULAR | Status: AC | PRN
  Administered 2019-05-07: 30 mL via SUBCUTANEOUS
  Filled 2019-05-07: qty 30

## 2019-05-07 MED ORDER — ZOLPIDEM TARTRATE 5 MG PO TABS: 5.0000 mg | ORAL_TABLET | Freq: Every evening | ORAL | Status: DC | PRN

## 2019-05-07 MED ORDER — COCONUT OIL OIL: 1.0000 "application " | TOPICAL_OIL | Status: DC | PRN

## 2019-05-07 MED ORDER — OXYCODONE-ACETAMINOPHEN 5-325 MG PO TABS: 2.0000 | ORAL_TABLET | ORAL | Status: DC | PRN

## 2019-05-07 MED ORDER — SENNOSIDES-DOCUSATE SODIUM 8.6-50 MG PO TABS
2.0000 | ORAL_TABLET | ORAL | Status: DC
  Administered 2019-05-08 – 2019-05-09 (×3): 2 via ORAL
  Filled 2019-05-07 (×3): qty 2

## 2019-05-07 MED ORDER — OXYTOCIN 40 UNITS IN NORMAL SALINE INFUSION - SIMPLE MED: 2.5000 [IU]/h | INTRAVENOUS | Status: DC

## 2019-05-07 MED ORDER — FENTANYL CITRATE (PF) 100 MCG/2ML IJ SOLN
INTRAMUSCULAR | Status: AC
  Administered 2019-05-07: 100 ug
  Filled 2019-05-07: qty 2

## 2019-05-07 MED ORDER — IBUPROFEN 600 MG PO TABS
600.0000 mg | ORAL_TABLET | Freq: Four times a day (QID) | ORAL | Status: DC
  Administered 2019-05-08 – 2019-05-09 (×8): 600 mg via ORAL
  Filled 2019-05-07 (×8): qty 1

## 2019-05-07 NOTE — H&P (Signed)
Kristin Jackson is a 24 y.o. female G3P1011 with IUP at [redacted]w[redacted]d presenting for SOL. Pt states she has been having irregular, every 5 minutes contractions, associated with none vaginal bleeding for no hours since this morning.   Membranes are intact, with active fetal movement.   PNCare at [facility] since 34 wks.  Prenatal History/Complications: Late to prenatal care; started care in 3rd trimester. From chart review appears patient thought she was only 23 weeks when she went for US.   Past Medical History: History reviewed. No pertinent past medical history.  Past Surgical History: Past Surgical History:  Procedure Laterality Date  . DILATION AND CURETTAGE OF UTERUS N/A 03/15/2017   Procedure: DILATATION AND CURETTAGE (Suction D & C with Ultrasound);  Surgeon: Pickens, Charlie, MD;  Location: [facility];  Service: Gynecology;  Laterality: N/A;    Obstetrical History: OB History    Gravida  3   Para  1   Term  1   Preterm  0   AB  1   Living  1     SAB  1   TAB  0   Ectopic  0   Multiple  0   Live Births  1            Social History: Social History   Socioeconomic History  . Marital status: Unknown    Spouse name: Not on file  . Number of children: Not on file  . Years of education: Not on file  . Highest education level: Not on file  Occupational History  . Not on file  Tobacco Use  . Smoking status: Never Smoker  . Smokeless tobacco: Never Used  Substance and Sexual Activity  . Alcohol use: No  . Drug use: No  . Sexual activity: Not on file  Other Topics Concern  . Not on file  Social History Narrative  . Not on file   Social Determinants of Health   Financial Resource Strain:   . Difficulty of Paying Living Expenses:   Food Insecurity:   . Worried About Running Out of Food in the Last Year:   . Ran Out of Food in the Last Year:   Transportation Needs:   . Lack of Transportation (Medical):   . Lack of Transportation (Non-Medical):   Physical Activity:    . Days of Exercise per Week:   . Minutes of Exercise per Session:   Stress:   . Feeling of Stress :   Social Connections:   . Frequency of Communication with Friends and Family:   . Frequency of Social Gatherings with Friends and Family:   . Attends Religious Services:   . Active Member of Clubs or Organizations:   . Attends Club or Organization Meetings:   . Marital Status:     Family History: History reviewed. No pertinent family history.  Allergies: No Known Allergies  Medications Prior to Admission  Medication Sig Dispense Refill Last Dose  . doxycycline (VIBRAMYCIN) 100 MG capsule Take 1 capsule (100 mg total) by mouth 2 (two) times daily. 10 capsule 0 Unknown at Unknown time  . ferrous gluconate (FERGON) 324 MG tablet Take 1 tablet (324 mg total) by mouth 2 (two) times daily with a meal. 60 tablet 3 Unknown at Unknown time  . ibuprofen (ADVIL,MOTRIN) 600 MG tablet Take 1 tablet (600 mg total) by mouth every 6 (six) hours as needed (mild pain). 30 tablet 0         Review of Systems   Constitutional:   Negative for fever and chills Eyes: Negative for visual disturbances Respiratory: Negative for shortness of breath, dyspnea Cardiovascular: Negative for chest pain or palpitations  Gastrointestinal: Negative for vomiting, diarrhea and constipation.  POSITIVE for abdominal pain (contractions) Genitourinary: Negative for dysuria and urgency Musculoskeletal: Negative for back pain, joint pain, myalgias  Neurological: Negative for dizziness and headaches      Blood pressure (!) 145/76, pulse 86, temperature 98.5 F (36.9 C), temperature source Oral, resp. rate 18, last menstrual period 10/27/2018, SpO2 99 %, unknown if currently breastfeeding. General appearance: alert, cooperative and no distress Lungs: normal respiratory effort Heart: regular rate and rhythm Abdomen: soft, non-tender; bowel sounds normal Extremities: Homans sign is negative, no sign of DVT DTR's  2+ Presentation: cephalic Fetal monitoring  Baseline: 135 bpm, mod var, present acel, neg decels.  Uterine activity  Irregular, toco readjusted Dilation: 5.5 Effacement (%): 70 Station: -2 Exam by:: christina robinson rn   Prenatal labs: ABO, Rh: --/--/O POS (04/18 1436) Antibody: NEG (04/18 1436) Rubella:  immune RPR:   NR HBsAg:   Neg HIV:   NR GBS:   unknown 1 hr Glucola 128 Genetic screening  Not done Anatomy US Date changed  Prenatal Transfer Tool  Maternal Diabetes: No Genetic Screening: Declined Maternal Ultrasounds/Referrals: Normal Fetal Ultrasounds or other Referrals:  None Maternal Substance Abuse:  No Significant Maternal Medications:  None Significant Maternal Lab Results: None     Results for orders placed or performed during the hospital encounter of 05/07/19 (from the past 24 hour(s))  CBC   Collection Time: 05/07/19  2:36 PM  Result Value Ref Range   WBC 8.0 4.0 - 10.5 K/uL   RBC 3.85 (L) 3.87 - 5.11 MIL/uL   Hemoglobin 10.5 (L) 12.0 - 15.0 g/dL   HCT 62.2 (L) 63.3 - 35.4 %   MCV 83.6 80.0 - 100.0 fL   MCH 27.3 26.0 - 34.0 pg   MCHC 32.6 30.0 - 36.0 g/dL   RDW 56.2 56.3 - 89.3 %   Platelets 183 150 - 400 K/uL   nRBC 0.0 0.0 - 0.2 %  Type and screen [facility]   Collection Time: 05/07/19  2:36 PM  Result Value Ref Range   ABO/RH(D) O POS    Antibody Screen NEG    Sample Expiration      05/10/2019,2359 Performed at [facility], [address] [place], [place] [address]   Respiratory Panel by RT PCR (Flu A&B, Covid) - Nasopharyngeal Swab   Collection Time: 05/07/19  2:52 PM   Specimen: Nasopharyngeal Swab  Result Value Ref Range   SARS Coronavirus 2 by RT PCR NEGATIVE NEGATIVE   Influenza A by PCR NEGATIVE NEGATIVE   Influenza B by PCR NEGATIVE NEGATIVE    Assessment: Kristin Jackson is a 24 y.o. G3P1011 with an IUP at [redacted]w[redacted]d presenting for SOL.  Patient does not want epidural at this time. BPs slightly elevated  upon admission; will draw CMP and PCR.  Plan: #Labor: expectant management #Pain:  Per request #FWB Cat 1 #ID: GBS: unknown #MOF:  breast #MOC: none #Circ: na (female).    Charlesetta Garibaldi Rhen Dossantos 05/07/2019, 4:19 PM  Charlesetta Garibaldi Corynne Scibilia 05/07/2019, 4:19 PM

## 2019-05-07 NOTE — Lactation Note (Signed)
This note was copied from a baby's chart. Lactation Consultation Note  Patient Name: Kristin Jackson [id number] Date: 05/07/2019 Reason for consult: Initial assessment;Early term 100-38.6wks  Interpreter for Swahili is mom's aunt; LC also called [place] interpreter services to conclude visit but no interpreter for Swahili available at this time.  Visited with mom of a 3 hours old ETI female, she's a P2 and experienced BF. She BF her first child for 2 years but mom hasn't revised hand expression with hospital staff yet because her interpreter was about to leave at the time of [facility] consultation.   RN Kristin Jackson reported to [facility] that mom likes the typical cradle hold but she's not getting a deep latch with it. She just requested formula to start supplementing baby, several bottles of Similac 20 calorie formula were brought to the room. Mom's feeding choice on admission was to do both, breast and formula feeding.  Baby already nursing when entering the room, but just like RN Kristin Jackson reported, latch was shallow, mom was also talking on the phone with Kristin Jackson while BF. She stopped BF shortly after and kept baby STS, praised for her efforts. Let mom know that is she needs assistance with BF through the night, her RN could help her out since there is no LC for night shift coverage.   Reviewed normal newborn behavior, feeding cues and size of baby's stomach, her RN already went over supplementation guidelines when feeding baby at the breast.  Feeding plan:  1. Encouraged mom to feed baby STS 8-12 times/24 hours or sooner if feeding cues are present 2. Mom will also supplement with Similac formula per feeding choice on admission but was encouraged to offer the breast first  BF brochure, BF resources and feeding diary were reviewed. Mom reported all questions and concerns were answered, she's aware of LC OP services and will call PRN.   Maternal Data Formula Feeding for Exclusion: Yes Reason for exclusion: Mother's  choice to formula and breast feed on admission Has patient been taught Hand Expression?: No Does the patient have breastfeeding experience prior to this delivery?: Yes  Feeding Feeding Type: Breast Fed  LATCH Score Latch: Grasps breast easily, tongue down, lips flanged, rhythmical sucking.  Audible Swallowing: None  Type of Nipple: Everted at rest and after stimulation  Comfort (Breast/Nipple): Soft / non-tender  Hold (Positioning): Assistance needed to correctly position infant at breast and maintain latch.  LATCH Score: 7  Interventions Interventions: Breast feeding basics reviewed  Lactation Tools Discussed/Used WIC Program: No   Consult Status Consult Status: Follow-up Date: 05/08/19 Follow-up type: In-patient    Kristin Jackson Kristin Jackson 05/07/2019, 11:13 PM

## 2019-05-07 NOTE — MAU Note (Signed)
Kristin Jackson is a 24 y.o. at [redacted]w[redacted]d here in MAU reporting: contractions that started this morning. They are now coming every 5 minutes. No VB or LOF. +FM  Onset of complaint: today  Pain score: 10/10  Vitals:   05/07/19 1304  BP: 140/74  Pulse: 88  Resp: 16  Temp: 98.5 F (36.9 C)  SpO2: 99%     FHT: 135  Lab orders placed from triage: none

## 2019-05-07 NOTE — Discharge Summary (Addendum)
Postpartum Discharge Summary  ** PP visit conducted with Sw[place]ili [employer] interpreter **    Patient Name: Kristin Jackson DOB: [birth year] MRN: [id number]  Date of admission: 05/07/2019 Delivering Provider: [place]   Date of discharge: 05/09/2019  Admitting diagnosis: Indication for care in labor and delivery, antepartum [O75.9] Intrauterine pregnancy: [redacted]w[redacted]d    Secondary diagnosis:  Active Problems:   Language barrier   Indication for care in labor and delivery, antepartum   Mild preeclampsia, third trimester  Additional problems: none     Discharge diagnosis: Term Pregnancy Delivered and Preeclampsia (mild)                                                                                                Post partum procedures:none  Augmentation:  None  Complications: None  Hospital course:  Onset of Labor With Vaginal Delivery     24y.o. yo G3P1011 at 389w2das admitted in Active Labor on 05/07/2019. Patient had an uncomplicated labor course as follows: She arrived to MAU at 4.5 cm, then changed to 5.5 cm, cervix thinned out. She then delivered NSVD en caul in the bed without epidural. Her BPs were elevated but not severe, and pre-e labs showed P/C ratio to be 0.40, giving a dx of pre-e without severe features, not requiring mag sulfate. Membrane Rupture Time/Date: 7:41 PM ,05/07/2019   Intrapartum Procedures: Episiotomy:                                          Lacerations:  1st degree [2];Periurethral [8]  Patient had a delivery of a Viable infant. 05/07/2019  Information for the patient's newborn:  ThSanvi, Ehler0[[id number]]Delivery Method: Vaginal, Spontaneous(Filed from Delivery Summary)     Pateint developed postpartum preeclampsia w/o SF, started on Procardia PPD#1 for BPs 140s/70s (none in severe range).  She is ambulating, tolerating a regular diet, passing flatus, and urinating well. Patient is discharged home in stable condition on 05/09/19 with  plans for a BP check at [facility] 1[facility].  Delivery time: 7:41 PM    Magnesium Sulfate received: No BMZ received: No Rhophylac:N/A MMR:N/A Transfusion:No  Physical exam  Vitals:   05/08/19 1149 05/08/19 2142 05/09/19 0541 05/09/19 0950  BP: 126/77 125/78 127/76 128/69  Pulse: 78 68 65 72  Resp: 16 18 17    Temp: 98.5 F (36.9 C) 98 F (36.7 C) 98.2 F (36.8 C)   TempSrc: Oral Oral Oral   SpO2: 100%  100%   Height:       General: alert, cooperative and no distress Lochia: appropriate Uterine Fundus: firm Incision: N/A DVT Evaluation: No evidence of DVT seen on physical exam. Negative Homan's sign. No cords or calf tenderness. Labs: Lab Results  Component Value Date   WBC 8.8 05/08/2019   HGB 9.9 (L) 05/08/2019   HCT 30.3 (L) 05/08/2019   MCV 84.2 05/08/2019   PLT 205 05/08/2019   CMP Latest Ref Rng & Units 05/07/2019  Glucose 70 -  99 mg/dL 66(L)  BUN 6 - 20 mg/dL <5(L)  Creatinine 0.44 - 1.00 mg/dL 0.32(L)  Sodium 135 - 145 mmol/L 140  Potassium 3.5 - 5.1 mmol/L 3.0(L)  Chloride 98 - 111 mmol/L 108  CO2 22 - 32 mmol/L 21(L)  Calcium 8.9 - 10.3 mg/dL 8.3(L)  Total Protein 6.5 - 8.1 g/dL 5.8(L)  Total Bilirubin 0.3 - 1.2 mg/dL 0.5  Alkaline Phos 38 - 126 U/L 85  AST 15 - 41 U/L 39  ALT 0 - 44 U/L 26   Edinburgh Score: Edinburgh Postnatal Depression Scale Screening Tool 05/09/2019  I have been able to laugh and see the funny side of things. 0  I have looked forward with enjoyment to things. 0  I have blamed myself unnecessarily when things went wrong. 1  I have been anxious or worried for no good reason. 0  I have felt scared or panicky for no good reason. 0  Things have been getting on top of me. 1  I have been so unhappy that I have had difficulty sleeping. 0  I have felt sad or miserable. 0  I have been so unhappy that I have been crying. 0  The thought of harming myself has occurred to me. 0  Edinburgh Postnatal Depression Scale Total 2    Discharge  instruction: per After Visit Summary and "Baby and Me Booklet".  After visit meds:  Allergies as of 05/09/2019   No Known Allergies     Medication List    STOP taking these medications   doxycycline 100 MG capsule Commonly known as: VIBRAMYCIN     TAKE these medications   ibuprofen 600 MG tablet Commonly known as: ADVIL Take 1 tablet (600 mg total) by mouth every 6 (six) hours. What changed:   when to take this  reasons to take this   NIFEdipine 30 MG 24 hr tablet Commonly known as: ADALAT CC Take 1 tablet (30 mg total) by mouth daily.       Diet: routine diet  Activity: Advance as tolerated. Pelvic rest for 6 weeks.    Follow up Appt: Future Appointments  Date Time Provider [place]  05/10/2019 11:00 AM [place] [facility]  05/10/2019 11:00 AM [place] [facility]   Follow up Visit: [place] for [facility]. Schedule an appointment as soon as possible for a visit in 1 week(s).   Specialty: Obstetrics and Gynecology Why: for a blood pressure check Contact information: [address], [address] [id number] [address] [address] [phone]       Department, [facility]. Schedule an appointment as soon as possible for a visit in 4 week(s).   Why: for a postpartum appointment Contact information: [place] [address] [phone]          [x]  Message to [facility] for a blood pressure check in 1 week Patient to call [facility] to schedule PP visit.    Newborn Data: Live born female  Birth Weight: 3085gm (6lb 12.8oz)  APGAR: 66, 9  Newborn Delivery   Birth date/time: 05/07/2019 19:41:00 Delivery type: Vaginal, Spontaneous      Baby Feeding: Breast Disposition:home with mother   05/09/2019 Myrtis Ser, CNM  11:13 AM

## 2019-05-07 NOTE — MAU Provider Note (Signed)
Kristin Jackson is a 24 y.o. female G3P1011 with IUP at [redacted]w[redacted]d presenting for SOL. Pt states she has been having irregular, every 5 minutes contractions, associated with none vaginal bleeding for no hours since this morning.   Membranes are intact, with active fetal movement.   P[facility]are at [facility] since 34 wks.  Prenatal History/Complications: Late to prenatal care; started care in 3rd trimester. From chart review appears patient thought she was only 23 weeks when she went for [place].   Past Medical History: History reviewed. No pertinent past medical history.  Past Surgical History: Past Surgical History:  Procedure Laterality Date  . DILATION AND CURETTAGE OF UTERUS N/A 03/15/2017   Procedure: DILATATION AND CURETTAGE (Suction D & C with Ultrasound);  Surgeon: Aletha Halim, MD;  Location: [facility];  Service: Gynecology;  Laterality: N/A;    Obstetrical History: OB History    Gravida  3   Para  1   Term  1   Preterm  0   AB  1   Living  1     SAB  1   TAB  0   Ectopic  0   Multiple  0   Live Births  1            Social History: Social History   Socioeconomic History  . Marital status: Unknown    Spouse name: Not on file  . Number of children: Not on file  . Years of education: Not on file  . Highest education level: Not on file  Occupational History  . Not on file  Tobacco Use  . Smoking status: Never Smoker  . Smokeless tobacco: Never Used  Substance and Sexual Activity  . Alcohol use: No  . Drug use: No  . Sexual activity: Not on file  Other Topics Concern  . Not on file  Social History Narrative  . Not on file   Social Determinants of Health   Financial Resource Strain:   . Difficulty of Paying Living Expenses:   Food Insecurity:   . Worried About Charity fundraiser in the Last Year:   . Arboriculturist in the Last Year:   Transportation Needs:   . Film/video editor (Medical):   Marland Kitchen Lack of Transportation (Non-Medical):   Physical Activity:    . Days of Exercise per Week:   . Minutes of Exercise per Session:   Stress:   . Feeling of Stress :   Social Connections:   . Frequency of Communication with Friends and Family:   . Frequency of Social Gatherings with Friends and Family:   . Attends Religious Services:   . Active Member of Clubs or Organizations:   . Attends Archivist Meetings:   Marland Kitchen Marital Status:     Family History: History reviewed. No pertinent family history.  Allergies: No Known Allergies  Medications Prior to Admission  Medication Sig Dispense Refill Last Dose  . doxycycline (VIBRAMYCIN) 100 MG capsule Take 1 capsule (100 mg total) by mouth 2 (two) times daily. 10 capsule 0 Unknown at Unknown time  . ferrous gluconate (FERGON) 324 MG tablet Take 1 tablet (324 mg total) by mouth 2 (two) times daily with a meal. 60 tablet 3 Unknown at Unknown time  . ibuprofen (ADVIL,MOTRIN) 600 MG tablet Take 1 tablet (600 mg total) by mouth every 6 (six) hours as needed (mild pain). 30 tablet 0         Review of Systems   Constitutional:  Negative for fever and chills Eyes: Negative for visual disturbances Respiratory: Negative for shortness of breath, dyspnea Cardiovascular: Negative for chest pain or palpitations  Gastrointestinal: Negative for vomiting, diarrhea and constipation.  POSITIVE for abdominal pain (contractions) Genitourinary: Negative for dysuria and urgency Musculoskeletal: Negative for back pain, joint pain, myalgias  Neurological: Negative for dizziness and headaches      Blood pressure (!) 145/76, pulse 86, temperature 98.5 F (36.9 C), temperature source Oral, resp. rate 18, last menstrual period 10/27/2018, SpO2 99 %, unknown if currently breastfeeding. General appearance: alert, cooperative and no distress Lungs: normal respiratory effort Heart: regular rate and rhythm Abdomen: soft, non-tender; bowel sounds normal Extremities: Homans sign is negative, no sign of DVT DTR's  2+ Presentation: cephalic Fetal monitoring  Baseline: 135 bpm, mod var, present acel, neg decels.  Uterine activity  Irregular, toco readjusted Dilation: 5.5 Effacement (%): 70 Station: -2 Exam by:: christina robinson rn   Prenatal labs: ABO, Rh: --/--/O POS (04/18 1436) Antibody: NEG (04/18 1436) Rubella:  immune RPR:   NR HBsAg:   Neg HIV:   NR GBS:   unknown 1 hr Glucola 128 Genetic screening  Not done Anatomy US Date changed  Prenatal Transfer Tool  Maternal Diabetes: No Genetic Screening: Declined Maternal Ultrasounds/Referrals: Normal Fetal Ultrasounds or other Referrals:  None Maternal Substance Abuse:  No Significant Maternal Medications:  None Significant Maternal Lab Results: None     Results for orders placed or performed during the hospital encounter of 05/07/19 (from the past 24 hour(s))  CBC   Collection Time: 05/07/19  2:36 PM  Result Value Ref Range   WBC 8.0 4.0 - 10.5 K/uL   RBC 3.85 (L) 3.87 - 5.11 MIL/uL   Hemoglobin 10.5 (L) 12.0 - 15.0 g/dL   HCT 62.2 (L) 63.3 - 35.4 %   MCV 83.6 80.0 - 100.0 fL   MCH 27.3 26.0 - 34.0 pg   MCHC 32.6 30.0 - 36.0 g/dL   RDW 56.2 56.3 - 89.3 %   Platelets 183 150 - 400 K/uL   nRBC 0.0 0.0 - 0.2 %  Type and screen [facility]   Collection Time: 05/07/19  2:36 PM  Result Value Ref Range   ABO/RH(D) O POS    Antibody Screen NEG    Sample Expiration      05/10/2019,2359 Performed at [facility], [address] [place], [place] [address]   Respiratory Panel by RT PCR (Flu A&B, Covid) - Nasopharyngeal Swab   Collection Time: 05/07/19  2:52 PM   Specimen: Nasopharyngeal Swab  Result Value Ref Range   SARS Coronavirus 2 by RT PCR NEGATIVE NEGATIVE   Influenza A by PCR NEGATIVE NEGATIVE   Influenza B by PCR NEGATIVE NEGATIVE    Assessment: Kristin Jackson is a 24 y.o. G3P1011 with an IUP at [redacted]w[redacted]d presenting for SOL.  Patient does not want epidural at this time. BPs slightly elevated  upon admission; will draw CMP and PCR.  Plan: #Labor: expectant management #Pain:  Per request #FWB Cat 1 #ID: GBS: unknown #MOF:  breast #MOC: none #Circ: na (female).    Charlesetta Garibaldi Lizvet Chunn 05/07/2019, 4:19 PM  Charlesetta Garibaldi Zelia Yzaguirre 05/07/2019, 4:19 PM

## 2019-05-08 DIAGNOSIS — Z3A38 38 weeks gestation of pregnancy: Secondary | ICD-10-CM

## 2019-05-08 DIAGNOSIS — O1404 Mild to moderate pre-eclampsia, complicating childbirth: Secondary | ICD-10-CM

## 2019-05-08 LAB — RPR: RPR Ser Ql: NONREACTIVE

## 2019-05-08 LAB — CBC
HCT: 30.3 % — ABNORMAL LOW (ref 36.0–46.0)
Hemoglobin: 9.9 g/dL — ABNORMAL LOW (ref 12.0–15.0)
MCH: 27.5 pg (ref 26.0–34.0)
MCHC: 32.7 g/dL (ref 30.0–36.0)
MCV: 84.2 fL (ref 80.0–100.0)
Platelets: 205 10*3/uL (ref 150–400)
RBC: 3.6 MIL/uL — ABNORMAL LOW (ref 3.87–5.11)
RDW: 14.2 % (ref 11.5–15.5)
WBC: 8.8 10*3/uL (ref 4.0–10.5)
nRBC: 0 % (ref 0.0–0.2)

## 2019-05-08 MED ORDER — IBUPROFEN 600 MG PO TABS: 600.0000 mg | ORAL_TABLET | Freq: Four times a day (QID) | ORAL | 0 refills | Status: DC

## 2019-05-08 MED ORDER — NIFEDIPINE ER OSMOTIC RELEASE 30 MG PO TB24
30.0000 mg | ORAL_TABLET | Freq: Every day | ORAL | Status: DC
  Administered 2019-05-08 – 2019-05-09 (×2): 30 mg via ORAL
  Filled 2019-05-08 (×2): qty 1

## 2019-05-08 MED ORDER — NIFEDIPINE ER 30 MG PO TB24: 30.0000 mg | ORAL_TABLET | Freq: Every day | ORAL | 1 refills | Status: DC

## 2019-05-08 NOTE — Lactation Note (Signed)
This note was copied from a baby's chart. Lactation Consultation Note  Patient Name: Kristin Jackson [id number] Date: 05/08/2019   Follow-up assessment of 26-hours baby Kristin but mother and baby are currently sleeping.     Kalijah Zeiss A Higuera Ancidey 05/08/2019, 9:55 PM

## 2019-05-08 NOTE — Progress Notes (Addendum)
Swahili interpreter used for this encounter  Post Partum Day 1 Subjective: no complaints, up ad lib, voiding, tolerating PO and + flatus  Objective: Blood pressure (!) 142/76, pulse 72, temperature 98.4 F (36.9 C), temperature source Oral, resp. rate 17, height 5\' 2"  (1.575 m), last menstrual period 10/27/2018, SpO2 100 %, unknown if currently breastfeeding.  Physical Exam:  General: alert, cooperative and fatigued Lochia: appropriate Uterine Fundus: firm  Recent Labs    05/07/19 1436  HGB 10.5*  HCT 32.2*    Assessment/Plan: Plan for discharge tomorrow  Systolic BP in 140's, Continue to monitor BP, begin Procardia.  Feed: breast Contraception: none   LOS: 1 day   05/09/19, MS3 05/08/2019, 6:07 AM   GME ATTESTATION:  I saw and evaluated the patient. I agree with the findings and the plan of care as documented in the student's note.  05/10/2019, DO OB Fellow, Faculty [facility], [facility] for [facility] 05/08/2019 6:43 AM

## 2019-05-08 NOTE — Discharge Instructions (Signed)

## 2019-05-09 DIAGNOSIS — O1403 Mild to moderate pre-eclampsia, third trimester: Secondary | ICD-10-CM | POA: Diagnosis present

## 2019-05-09 MED FILL — IBUPROFEN 600 MG TABLET: 600 | 8 days supply | Qty: 30 | Fill #0

## 2019-05-09 MED FILL — NIFEdipine ER 30 MG TB24: 30 | 30 days supply | Qty: 30 | Fill #0

## 2019-05-09 NOTE — Clinical Social Work Maternal (Signed)
CLINICAL SOCIAL WORK MATERNAL/CHILD NOTE  Patient Details  Name: Kristin Jackson MRN: [id number] Date of Birth: 11/08/1995  Date:  05/09/2019  Clinical Social Worker Initiating Note:  Adoria Kawamoto, LCSW Date/Time: Initiated:  05/09/19/1120     Child's Name:  Kristin Jackson   Biological Parents:  Mother   Need for Interpreter:  Swahili   Reason for Referral:  Late or No Prenatal Care    Address:  [address] [place] [address]    Phone number:  [phone] (home)     Additional phone number: none   Household Members/Support Persons (HM/SP):   Household Member/Support Person 1   HM/SP Name Relationship DOB or Age  HM/SP -1 Kristin Jackson  son   04/16/14  HM/SP -2        HM/SP -3        HM/SP -4        HM/SP -5        HM/SP -6        HM/SP -7        HM/SP -8          Natural Supports (not living in the home):      Professional Supports: None   Employment: Full-time   Type of Work: [employer]   Education:  Other (comment)(MOB reports that in her country she stopped at the 5th level, which is 8th grade [place] Interpretor.)   Homebound arranged:  n/a  Financial Resources:  Medicaid   Other Resources:  WIC   Cultural/Religious Considerations Which May Impact Care:  none   Strengths:  Ability to meet basic needs , Compliance with medical plan , Home prepared for child    Psychotropic Medications:       None reported.   Pediatrician:     not chosen yet.   Pediatrician List:   [place]    [place]    [place]    [place]    [place]    [place]      Pediatrician Fax Number:    Risk Factors/Current Problems:  None   Cognitive State:  Able to Concentrate , Insightful , Alert    Mood/Affect:  Relaxed , Comfortable , Calm , Interested    CSW Assessment: CSW consulted as MOB started care at 34 weeks. CSW went to speak with her at bedside to address further needs.   CSW used interpretor Kevin  #[id number] to speak with MOB. CSW advised MOB of CSW's role and the reason for CSW coming to visit with mom. MOB reported that she tried to get care sooner but she works in the evening and the health department kept giving her evening appointments or asking her to come back. CSW apologized to MOB for this and advised MOB of the hospital drug screen policy. MOB reported that she understood and reports no CPS hx prior or at this time. MOB reported that she wanted to go sooner but wasn't able to.   CSW inquired from MOB on her mental health. MOB denies having any mental health diagnosis and reports that she isn't SI, HI or in a DV relationship. MOB reports that she has a crib for infant to sleep in once arrived home. MOB assured CSW that she has all needed items to care for infant at this time.   CSW took time to provide MOB with PPD and SIDS education. MOB reports that she has never had PPD and was receptive to CSW informing MOB   of signs and symptoms that MOB should look for in regards to PPD. MOB thanked CSW and reported no other needs.   CSW will monitor infants CDS and make report if warranted.   CSW Plan/Description:  No Further Intervention Required/No Barriers to Discharge, CSW Will Continue to Monitor Umbilical Cord Tissue Drug Screen Results and Make Report if [facility], Hospital Drug Screen Policy Information, Sudden Infant Death Syndrome (SIDS) Education, Perinatal Mood and Anxiety Disorder (PMADs) Education    Robb Matar, LCSWA 2019-10-06, 12:42 PM

## 2019-05-09 NOTE — Progress Notes (Signed)
Live interpreter used to explain plan of care and answered all questions.

## 2019-05-09 NOTE — Progress Notes (Signed)
Discharge teaching completed with Swahili video interpreter [place]

## 2019-05-10 ENCOUNTER — Telehealth: Payer: Self-pay | Admitting: Advanced Practice Midwife

## 2019-05-10 ENCOUNTER — Encounter (HOSPITAL_COMMUNITY): Payer: Self-pay

## 2019-05-10 ENCOUNTER — Ambulatory Visit (HOSPITAL_COMMUNITY): Payer: BC Managed Care – PPO

## 2019-05-10 NOTE — Telephone Encounter (Signed)
Spoke with patient's husband about her appointment. Although I had an Interpreter on the phone, he spoke [place]. He said he would bring her to her appointment.

## 2019-05-17 ENCOUNTER — Ambulatory Visit (INDEPENDENT_AMBULATORY_CARE_PROVIDER_SITE_OTHER): Payer: BC Managed Care – PPO | Admitting: *Deleted

## 2019-05-17 ENCOUNTER — Encounter: Payer: Self-pay | Admitting: *Deleted

## 2019-05-17 ENCOUNTER — Other Ambulatory Visit: Payer: Self-pay

## 2019-05-17 VITALS — BP 153/95 | HR 75 | Wt 149.1 lb

## 2019-05-17 DIAGNOSIS — Z0130 Encounter for examination of blood pressure without abnormal findings: Secondary | ICD-10-CM

## 2019-05-17 MED ORDER — NIFEDIPINE ER 30 MG PO TB24: 60.0000 mg | ORAL_TABLET | Freq: Every day | ORAL | 1 refills | Status: DC

## 2019-05-17 NOTE — Progress Notes (Signed)
Patient ID: Kristin Jackson, female   DOB: 1995/10/05, 24 y.o.   MRN: [id number] Patient seen and assessed by nursing staff during this encounter. I have reviewed the chart and agree with the documentation and plan. I have also made any necessary editorial changes.  Scheryl Darter, MD 05/17/2019 2:31 PM

## 2019-05-17 NOTE — Progress Notes (Signed)
Video interpreter Britta Mccreedy [phone] used for encounter. Pt here for BP check - 153/95. She denies H/A, blurry vision, seeing spots or dizziness. Following consult w/Dr. Debroah Loop, pt was instructed to increase dose of Nifedipine to 2 tablets (60 mg) daily beginning with today's dose. She should continue taking the medication until her PP appt. Pt does not know her pharmacy and for this reason, her brother was called. He stated to send new Rx to [facility] on [address] - done. Pt needs to schedule PP appt @ [facility] and she was advised to call them today for the appt. She voiced understanding of all information and instructions given.

## 2021-03-10 IMAGING — US US MFM OB DETAIL+14 WK
1 series · 13 of 28 positions shown · non-contrast
Comparison: none

[Series 1: us mfm ob detail+14 wk · 13 of 69 slices shown]
[im 3/69]
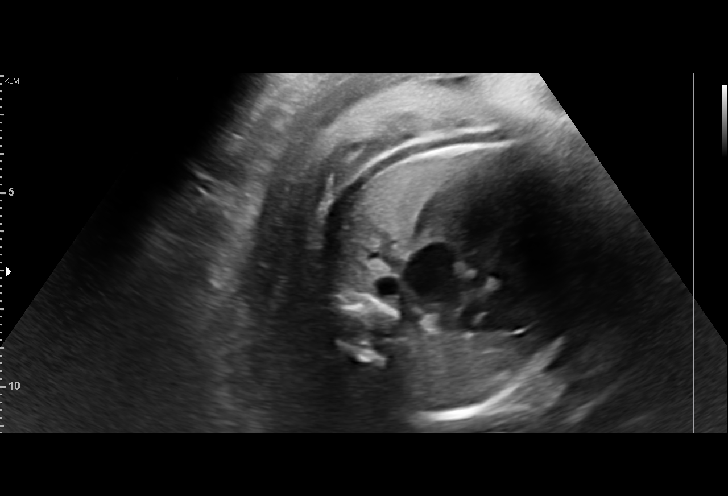
[im 8/69]
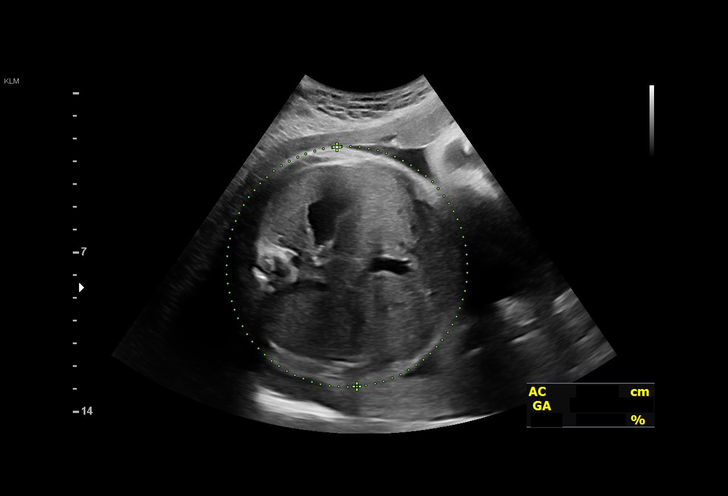
[im 13/69]
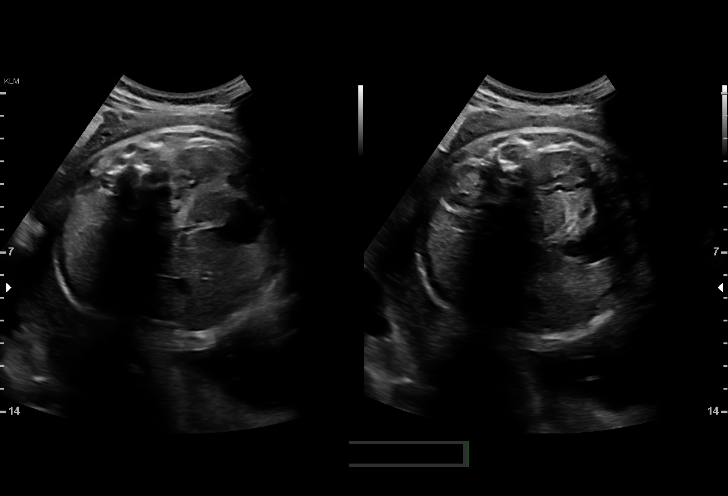
[im 18/69]
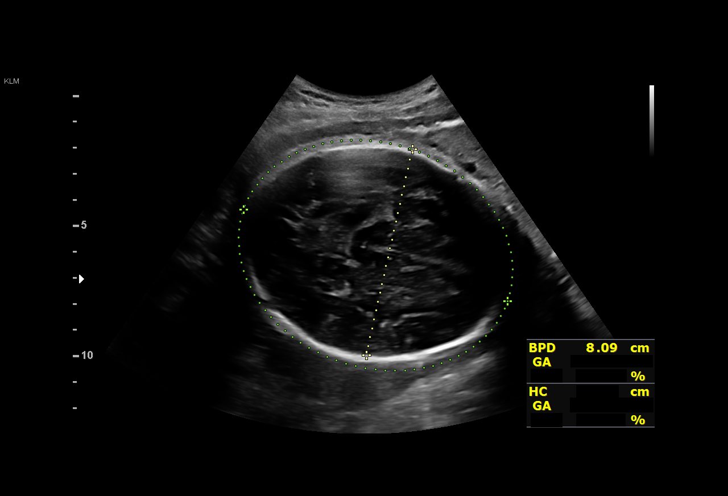
[im 23/69]
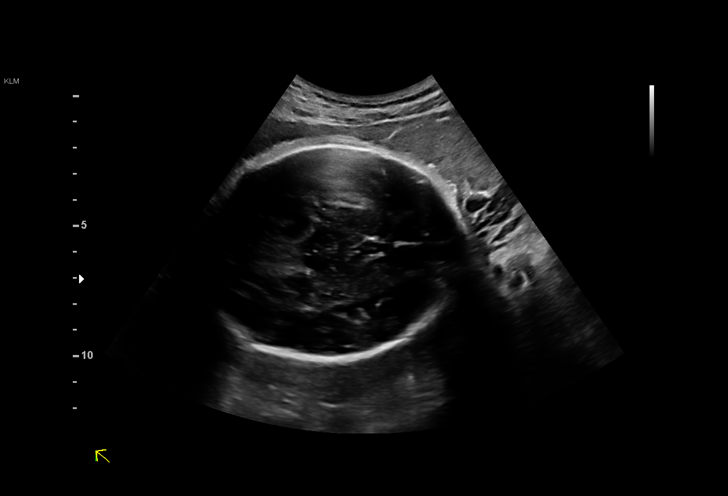
[im 28/69]
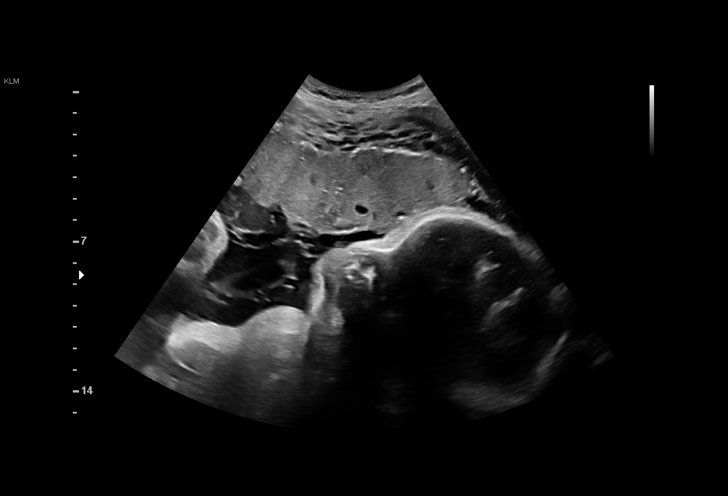
[im 36/69]
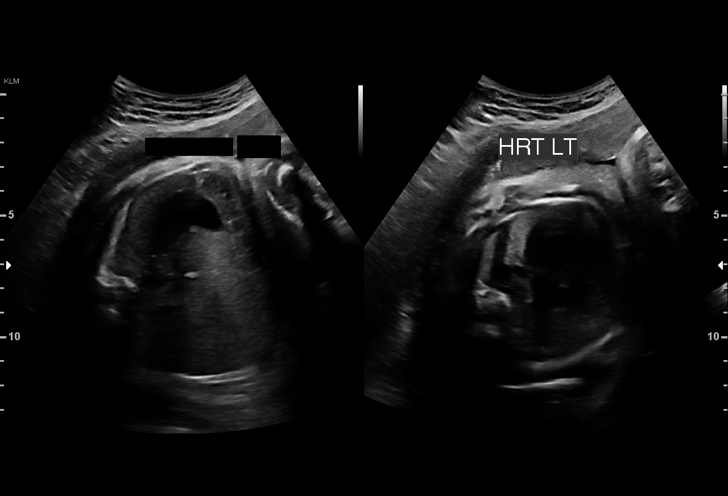
[im 41/69]
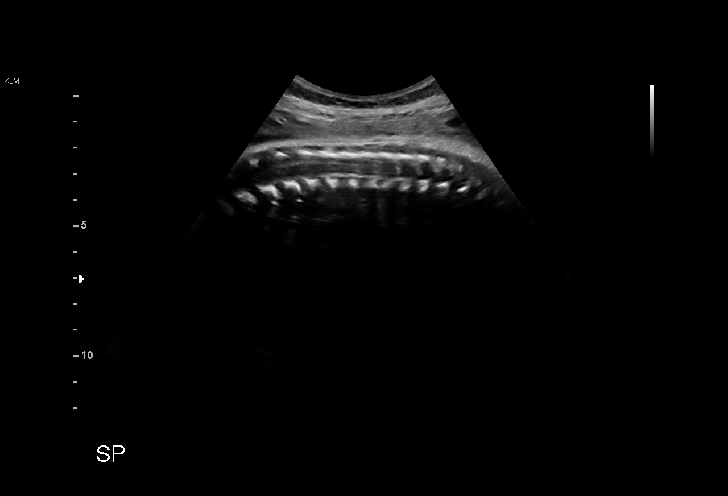
[im 46/69]
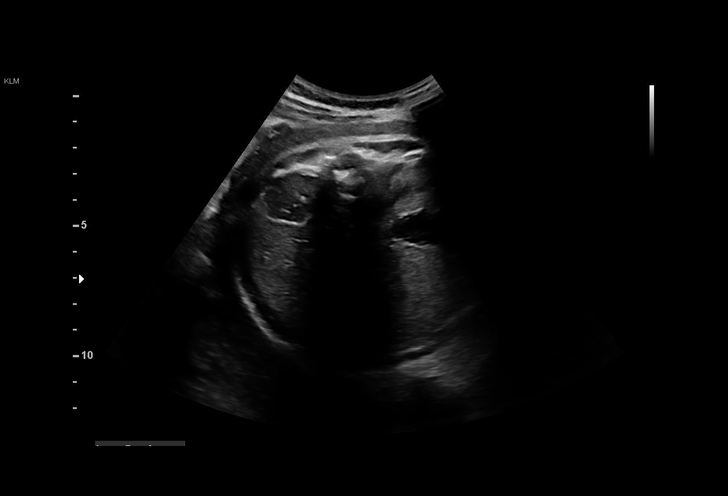
[im 51/69]
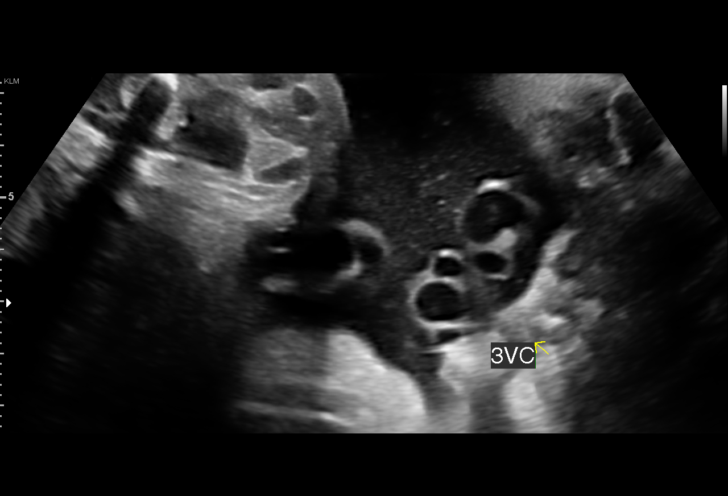
[im 56/69]
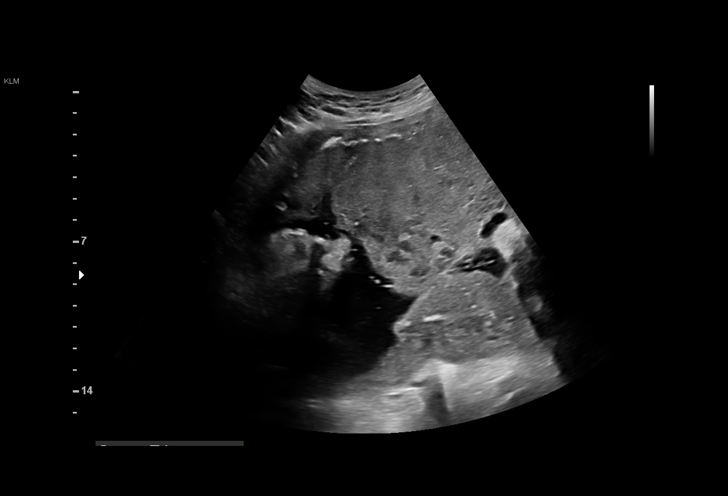
[im 61/69]
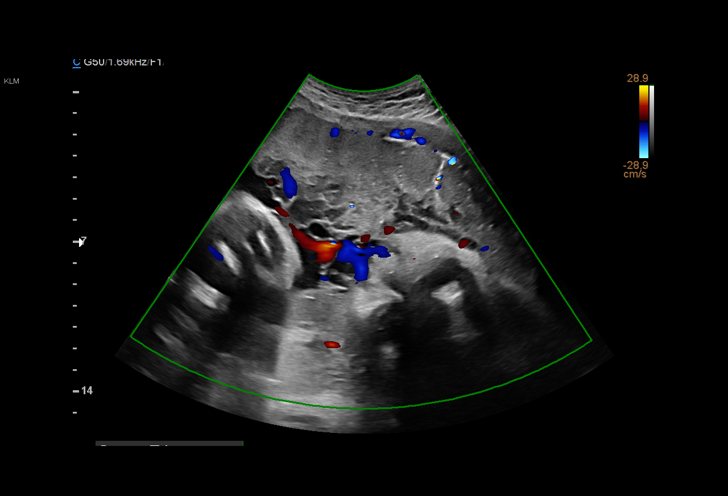
[im 66/69]
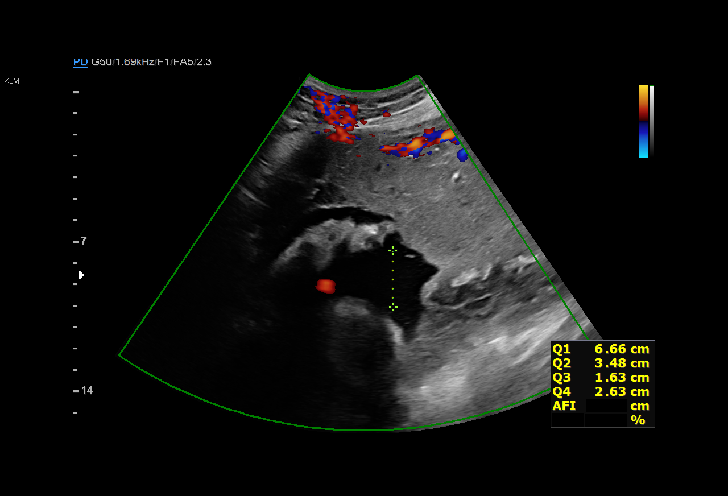

[13 of 28 positions shown; findings below may reference images not displayed]

Health
                   SHERY NP

                                                       SHERY
 ----------------------------------------------------------------------

 ----------------------------------------------------------------------
Indications

  Late to prenatal care, third trimester
  34 weeks gestation of pregnancy
  Encounter for antenatal screening for
  malformations
  Encounter for uncertain dates
 ----------------------------------------------------------------------
Fetal Evaluation

 Num Of Fetuses:          1
 Fetal Heart Rate(bpm):   142
 Cardiac Activity:        Observed
 Presentation:            Cephalic
 Placenta:                Anterior
 P. Cord Insertion:       Not well visualized

 Amniotic Fluid
 AFI FV:      Within normal limits

 AFI Sum(cm)     %Tile       Largest Pocket(cm)
 14.4            51

 RUQ(cm)       RLQ(cm)       LUQ(cm)        LLQ(cm)

Biometry

 BPD:      81.4  mm     G. Age:  32w 5d          6  %    CI:        72.49   %    70 - 86
                                                         FL/HC:       22.2  %    20.1 -
 HC:      304.1  mm     G. Age:  33w 6d          5  %    HC/AC:       0.91       0.93 -
 AC:      334.2  mm     G. Age:  37w 2d         98  %    FL/BPD:      83.0  %    71 - 87
 FL:       67.6  mm     G. Age:  34w 5d         43  %    FL/AC:       20.2  %    20 - 24
 HUM:      59.7  mm     G. Age:  34w 5d         61  %
 CER:      50.1  mm     G. Age:  N/A          > 95  %
 Est. FW:    0536   gm     6 lb 1 oz     76  %
OB History

 Gravidity:    3         Term:   1        Prem:   0        SAB:   1
 TOP:          0       Ectopic:  0        Living: 1
Gestational Age

 LMP:           23w 6d        Date:  10/27/18                 EDD:   08/03/19
 U/S Today:     34w 5d                                        EDD:   05/19/19
 Best:          34w 5d     Det. By:  U/S (04/12/19)           EDD:   05/19/19
Anatomy

 Cranium:               Appears normal         Aortic Arch:            Appears normal
 Cavum:                 Appears normal         Ductal Arch:            Appears normal
 Ventricles:            Appears normal         Diaphragm:              Appears normal
 Choroid Plexus:        Appears normal         Stomach:                Appears normal, left
                                                                       sided
 Cerebellum:            Appears normal         Abdomen:                Appears normal
 Posterior Fossa:       Appears normal         Abdominal Wall:         Not well visualized
 Nuchal Fold:           Not applicable (>20    Cord Vessels:           Appears normal (3
                        wks GA)                                        vessel cord)
 Face:                  Appears normal         Kidneys:                Appear normal
                        (orbits and profile)
 Lips:                  Appears normal         Bladder:                Appears normal
 Thoracic:              Appears normal         Spine:                  Not well visualized
 Heart:                 Appears normal         Upper Extremities:      Visualized
                        (4CH, axis, and
                        situs)
 RVOT:                  Not well visualized    Lower Extremities:      Visualized
 LVOT:                  Not well visualized

 Other:  Technically difficult due to advanced gestational age.
Cervix Uterus Adnexa

 Cervix
 Not visualized (advanced GA >68wks)

 Adnexa
 No abnormality visualized.
Impression

 Ms. Hippolyte is here for an anatomy exam in the third
 trimester.
 Normal anatomy was obtained today, however, suboptimal
 views of the anatomy was obtained secondary to fetal
 position.
 There is good fetal movement and amniotic fluid.
Recommendations

 Follow up growth scheduled in 4 weeks to trend fetal growth
 and complete fetal anatomy.

## 2022-01-19 NOTE — L&D Delivery Note (Signed)
LABOR COURSE IOL for A1GDM, progressed to complete with cytotec and pitocin.   Delivery Note Called to room and patient was complete and pushing. Head delivered OA to ROA. No nuchal cord present. Shoulder and body delivered in usual fashion. At 2007 a viable female was delivered via Vaginal, Spontaneous birth.   Infant with spontaneous cry, placed on mother's abdomen, dried and stimulated. Cord clamped x 2 after 3-minute delay, and cut by CNM per patient request. Cord blood drawn. Placenta delivered spontaneously with gentle cord traction. Appears intact. Fundus firm with massage and Pitocin. Labia, perineum, vagina, and cervix inspected with sterile gauze to reveal 1st degree perineal laceration which was not hemostatic after approximately 5 minutes of firm pressure. Repaired with 3.0 vicryl with good approximation and hemostasis noted.     APGAR: 8,9 ; weight  pending.   Cord: 3VC with the following complications:none.   Cord pH: not indicated  Anesthesia:  Episiotomy: None Lacerations: 1st degree laceration, repaired with 3.0 vicryl with good hemostasis and approximation noted.  Suture Repair: 3.0 vicryl Est. Blood Loss (mL): 88  Mom to postpartum.  Baby to Couplet care / Skin to Skin.  Richardson Landry, CNM @TODAY @ 8:40 PM

## 2022-09-24 ENCOUNTER — Other Ambulatory Visit: Payer: Self-pay | Admitting: Obstetrics & Gynecology

## 2022-09-24 DIAGNOSIS — O0930 Supervision of pregnancy with insufficient antenatal care, unspecified trimester: Secondary | ICD-10-CM

## 2022-09-24 LAB — OB RESULTS CONSOLE VARICELLA ZOSTER ANTIBODY, IGG: Varicella: IMMUNE

## 2022-09-24 LAB — OB RESULTS CONSOLE RPR: RPR: NONREACTIVE

## 2022-09-24 LAB — OB RESULTS CONSOLE ABO/RH: RH Type: POSITIVE

## 2022-09-24 LAB — OB RESULTS CONSOLE RUBELLA ANTIBODY, IGM: Rubella: IMMUNE

## 2022-09-24 LAB — OB RESULTS CONSOLE HGB/HCT, BLOOD
HCT: 33 (ref 29–41)
Hemoglobin: 10.9

## 2022-09-24 LAB — HEPATITIS C ANTIBODY: HCV Ab: NEGATIVE

## 2022-09-24 LAB — OB RESULTS CONSOLE HEPATITIS B SURFACE ANTIGEN: Hepatitis B Surface Ag: POSITIVE

## 2022-09-24 LAB — OB RESULTS CONSOLE HIV ANTIBODY (ROUTINE TESTING): HIV: NONREACTIVE

## 2022-09-24 LAB — OB RESULTS CONSOLE PLATELET COUNT: Platelets: 210

## 2022-09-24 LAB — OB RESULTS CONSOLE GC/CHLAMYDIA
Chlamydia: NEGATIVE
Neisseria Gonorrhea: NEGATIVE

## 2022-10-06 ENCOUNTER — Encounter: Payer: Self-pay | Admitting: *Deleted

## 2022-10-06 DIAGNOSIS — O24419 Gestational diabetes mellitus in pregnancy, unspecified control: Secondary | ICD-10-CM | POA: Insufficient documentation

## 2022-10-06 DIAGNOSIS — O24415 Gestational diabetes mellitus in pregnancy, controlled by oral hypoglycemic drugs: Secondary | ICD-10-CM | POA: Insufficient documentation

## 2022-10-06 DIAGNOSIS — O09293 Supervision of pregnancy with other poor reproductive or obstetric history, third trimester: Secondary | ICD-10-CM | POA: Insufficient documentation

## 2022-10-08 ENCOUNTER — Encounter: Payer: Self-pay | Admitting: Obstetrics & Gynecology

## 2022-10-08 ENCOUNTER — Ambulatory Visit: Payer: 59

## 2022-10-08 ENCOUNTER — Ambulatory Visit: Payer: 59 | Attending: Obstetrics and Gynecology

## 2022-10-08 ENCOUNTER — Encounter: Payer: Self-pay | Admitting: *Deleted

## 2022-10-08 ENCOUNTER — Ambulatory Visit: Payer: 59 | Attending: Maternal & Fetal Medicine | Admitting: Maternal & Fetal Medicine

## 2022-10-08 VITALS — BP 120/60 | HR 92 | Ht 61.0 in

## 2022-10-08 DIAGNOSIS — D649 Anemia, unspecified: Secondary | ICD-10-CM

## 2022-10-08 DIAGNOSIS — E669 Obesity, unspecified: Secondary | ICD-10-CM

## 2022-10-08 DIAGNOSIS — O0993 Supervision of high risk pregnancy, unspecified, third trimester: Secondary | ICD-10-CM | POA: Insufficient documentation

## 2022-10-08 DIAGNOSIS — Z3A35 35 weeks gestation of pregnancy: Secondary | ICD-10-CM

## 2022-10-08 DIAGNOSIS — O99013 Anemia complicating pregnancy, third trimester: Secondary | ICD-10-CM

## 2022-10-08 DIAGNOSIS — O0933 Supervision of pregnancy with insufficient antenatal care, third trimester: Secondary | ICD-10-CM | POA: Diagnosis not present

## 2022-10-08 DIAGNOSIS — O09293 Supervision of pregnancy with other poor reproductive or obstetric history, third trimester: Secondary | ICD-10-CM | POA: Diagnosis present

## 2022-10-08 DIAGNOSIS — O24410 Gestational diabetes mellitus in pregnancy, diet controlled: Secondary | ICD-10-CM

## 2022-10-08 DIAGNOSIS — O0930 Supervision of pregnancy with insufficient antenatal care, unspecified trimester: Secondary | ICD-10-CM | POA: Diagnosis present

## 2022-10-08 DIAGNOSIS — O99213 Obesity complicating pregnancy, third trimester: Secondary | ICD-10-CM | POA: Diagnosis not present

## 2022-10-08 DIAGNOSIS — O24419 Gestational diabetes mellitus in pregnancy, unspecified control: Secondary | ICD-10-CM

## 2022-10-08 NOTE — Progress Notes (Signed)
   Patient information  Patient Name: Kristin Jackson  Patient MRN:   [id number]  Referring practice: MFM Referring Provider: [facility]) Interpreter: A virtual interpreter [phone]) was used in the patient's preferred language for today's visit.  MFM CONSULT  Kristin Jackson is a 27 y.o. [id number] at [redacted]w[redacted]d here for ultrasound and consultation.   This patient was a late transfer of care from the [facility] health department due to gestational diabetes.  She has not started checking her blood capillary glucose levels because she is yet to see her diabetic education which is scheduled for 9/24.  I reminded her of this appointment. Since she has yet to start checking blood sugars she will return in 1 week for nonstress test.  Sonographic findings Single intrauterine pregnancy at 35w 6d. Fetal cardiac activity:  Observed and appears normal. Presentation: Cephalic. The anatomic structures that were well seen appear normal without evidence of soft markers. Due to poor acoustic windows some structures remain suboptimally visualized. Fetal biometry shows the estimated fetal weight at the 15 percentile. Amniotic fluid:  MVP: 4.13 cm. Placenta: Anterior Fundal.  Recommendations - EDD should be 11/06/2022 based on  LMP  (01/30/22). - NST next week - New OB visit and diabetic education session scheduled  I discussed the potential adverse obstetric, fetal and neonatal associations with gestational diabetes. I discussed the potential for fetal growth abnormalities and risk of stillbirth as well as monitoring through growth ultrasound and antenatal testing. Delivery timing may occur prior to EDD if indicatd based on fetal growth, anteatal testing and clincal course. The patient had time to ask questions that were answered to her satisfaction. She verbalized understanding of our discussion and request to proceed with the plan outlined in the recommendations.   There are  limitations of prenatal ultrasound such as the inability to detect certain abnormalities due to poor visualization. Various factors such as fetal position, gestational age and maternal body habitus may increase the difficulty in visualizing the fetal anatomy.       Review of Systems: A review of systems was performed and was negative except per HPI   Vitals and Physical Exam    10/08/2022    7:26 AM 10/06/2022    1:15 PM 05/17/2019   10:33 AM  Vitals with BMI  Height  5\' 1"    Weight   149 lbs 2 oz  BMI   27.26  Systolic 120  153  Diastolic 60  95  Pulse 92  75  Sitting comfortably on the sonogram table Nonlabored breathing Normal rate and rhythm Abdomen is nontender  Past pregnancies OB History  Gravida Para Term Preterm AB Living  4 2 2  0 1 2  SAB IAB Ectopic Multiple Live Births  1 0 0 0 2    # Outcome Date GA Lbr Len/2nd Weight Sex Type Anes PTL Lv  4 Current           3 Term 05/07/19 [redacted]w[redacted]d 09:40 / 00:01 3.085 kg F Vag-Spont None  LIV  2 SAB  [redacted]w[redacted]d         1 Term      Vag-Spont        I spent 45 minutes reviewing the patients chart, including labs and images as well as counseling the patient about her medical conditions. Greater than 50% of the time was spent in direct face-to-face patient counseling.  Braxton Feathers  MFM, [facility]   10/08/2022  9:54 AM

## 2022-10-09 LAB — OB RESULTS CONSOLE GC/CHLAMYDIA
Chlamydia: NEGATIVE
Neisseria Gonorrhea: NEGATIVE

## 2022-10-09 LAB — OB RESULTS CONSOLE GBS: GBS: NEGATIVE

## 2022-10-12 ENCOUNTER — Other Ambulatory Visit: Payer: Self-pay

## 2022-10-12 ENCOUNTER — Ambulatory Visit: Payer: Self-pay

## 2022-10-13 ENCOUNTER — Ambulatory Visit: Payer: 59 | Admitting: *Deleted

## 2022-10-13 ENCOUNTER — Other Ambulatory Visit: Payer: Self-pay

## 2022-10-13 ENCOUNTER — Ambulatory Visit: Payer: 59 | Attending: Maternal & Fetal Medicine | Admitting: *Deleted

## 2022-10-13 ENCOUNTER — Ambulatory Visit (INDEPENDENT_AMBULATORY_CARE_PROVIDER_SITE_OTHER): Payer: 59 | Admitting: Dietician

## 2022-10-13 ENCOUNTER — Other Ambulatory Visit (HOSPITAL_COMMUNITY): Payer: Self-pay

## 2022-10-13 ENCOUNTER — Encounter: Payer: 59 | Attending: Family Medicine | Admitting: Dietician

## 2022-10-13 VITALS — BP 111/65 | HR 83

## 2022-10-13 DIAGNOSIS — Z3A36 36 weeks gestation of pregnancy: Secondary | ICD-10-CM

## 2022-10-13 DIAGNOSIS — O24410 Gestational diabetes mellitus in pregnancy, diet controlled: Secondary | ICD-10-CM | POA: Insufficient documentation

## 2022-10-13 DIAGNOSIS — O09293 Supervision of pregnancy with other poor reproductive or obstetric history, third trimester: Secondary | ICD-10-CM | POA: Diagnosis not present

## 2022-10-13 DIAGNOSIS — O0993 Supervision of high risk pregnancy, unspecified, third trimester: Secondary | ICD-10-CM | POA: Insufficient documentation

## 2022-10-13 DIAGNOSIS — O24419 Gestational diabetes mellitus in pregnancy, unspecified control: Secondary | ICD-10-CM

## 2022-10-13 DIAGNOSIS — Z713 Dietary counseling and surveillance: Secondary | ICD-10-CM | POA: Diagnosis not present

## 2022-10-13 MED ORDER — ACCU-CHEK SOFTCLIX LANCETS MISC
12 refills | Status: DC
  Filled 2022-10-13: qty 100, fill #0

## 2022-10-13 MED ORDER — ACCU-CHEK GUIDE W/DEVICE KIT: 1.0000 | PACK | Freq: Four times a day (QID) | 0 refills | Status: AC

## 2022-10-13 MED ORDER — GLUCOSE BLOOD VI STRP
ORAL_STRIP | 12 refills | Status: DC
  Filled 2022-10-13: qty 100, fill #0

## 2022-10-13 MED ORDER — ACCU-CHEK GUIDE W/DEVICE KIT
1.0000 | PACK | Freq: Four times a day (QID) | 0 refills | Status: DC
  Filled 2022-10-13: qty 1, fill #0

## 2022-10-13 MED ORDER — ACCU-CHEK SOFTCLIX LANCETS MISC: 12 refills | Status: AC

## 2022-10-13 MED ORDER — GLUCOSE BLOOD VI STRP: ORAL_STRIP | 12 refills | Status: AC

## 2022-10-13 NOTE — Procedures (Signed)
Kristin Jackson 09/01/1995 [redacted]w[redacted]d  Fetus A Non-Stress Test Interpretation for 10/13/22 (NST only)  Indication:  GDM-diet controlled, Hx pre-e  Fetal Heart Rate A Mode: External Baseline Rate (A): 145 bpm Variability: Moderate Accelerations: 15 x 15 Decelerations: None Multiple birth?: No  Uterine Activity Mode: Palpation, Toco Contraction Frequency (min): None Resting Tone Palpated: Relaxed Resting Time: Adequate  Interpretation (Fetal Testing) Nonstress Test Interpretation: Reactive Comments: Dr. Darra Lis reviewed tracing.

## 2022-10-13 NOTE — Progress Notes (Signed)
Patient was seen for Gestational Diabetes self-management on 9/242024  Start time 0900  and End time 0930  Patient late - arrived at the wrong office.   Gestation [redacted]w[redacted]d  Zenab #[id number] [employer] Interpreter Swahili  Clinical: Medications: prenatal vitamin Medical History: GDM Labs: unknown  Dietary and Lifestyle History: Patient is from [place] and speaks Swahili.  NUTRITION INTERVENTION  Nutrition education (E-1) on the following topics:   Initial Follow-up  []  []  Definition of Gestational Diabetes [x]  []  Why dietary management is important in controlling blood glucose []  []  Effects each nutrient has on blood glucose levels []  []  Simple carbohydrates vs complex carbohydrates []  []  Fluid intake []  []  Creating a balanced meal plan []  []  Carbohydrate counting  []  []  When to check blood glucose levels [x]  []  Proper blood glucose monitoring techniques []  []  Effect of stress and stress reduction techniques  [x]  []  Exercise effect on blood glucose levels, appropriate exercise during pregnancy []  []  Importance of limiting caffeine and abstaining from alcohol and smoking []  []  Medications used for blood sugar control during pregnancy []  []  Hypoglycemia and rule of 15 []  []  Postpartum self care  Blood glucose monitor given: Accu Chek Guide Meter Lot # [id number] Exp: 08/07/2023 CBG: 98 mg/dL Request for strips and lancets from medical pool and these have been sent to her pharmacy.  Patient instructed to monitor glucose levels: FBS: 60 - <= 95 mg/dL; 2 hour: <= 253 mg/dL  Patient received handouts: None in her language Blood glucose meter with blood glucose goals On patient's phone, found a video from the [employer] on Diabetes in Pregnancy and patient is to watch this at home as she was late today and did not have an email.  Patient will be seen for follow-up as needed.

## 2022-10-14 ENCOUNTER — Other Ambulatory Visit: Payer: Self-pay

## 2022-10-14 ENCOUNTER — Ambulatory Visit: Payer: 59

## 2022-10-19 ENCOUNTER — Encounter: Payer: Self-pay | Admitting: *Deleted

## 2022-10-19 ENCOUNTER — Ambulatory Visit: Payer: Self-pay

## 2022-10-19 ENCOUNTER — Other Ambulatory Visit: Payer: Self-pay

## 2022-10-20 ENCOUNTER — Other Ambulatory Visit: Payer: Self-pay

## 2022-10-20 ENCOUNTER — Encounter: Payer: Self-pay | Admitting: Obstetrics and Gynecology

## 2022-10-20 ENCOUNTER — Ambulatory Visit (INDEPENDENT_AMBULATORY_CARE_PROVIDER_SITE_OTHER): Payer: 59 | Admitting: Obstetrics and Gynecology

## 2022-10-20 VITALS — BP 116/75 | HR 102 | Wt 160.7 lb

## 2022-10-20 DIAGNOSIS — Z603 Acculturation difficulty: Secondary | ICD-10-CM | POA: Diagnosis not present

## 2022-10-20 DIAGNOSIS — O0993 Supervision of high risk pregnancy, unspecified, third trimester: Secondary | ICD-10-CM

## 2022-10-20 DIAGNOSIS — Z3A37 37 weeks gestation of pregnancy: Secondary | ICD-10-CM

## 2022-10-20 DIAGNOSIS — Z3490 Encounter for supervision of normal pregnancy, unspecified, unspecified trimester: Secondary | ICD-10-CM

## 2022-10-20 DIAGNOSIS — Z758 Other problems related to medical facilities and other health care: Secondary | ICD-10-CM

## 2022-10-20 DIAGNOSIS — O24410 Gestational diabetes mellitus in pregnancy, diet controlled: Secondary | ICD-10-CM

## 2022-10-20 DIAGNOSIS — O09293 Supervision of pregnancy with other poor reproductive or obstetric history, third trimester: Secondary | ICD-10-CM

## 2022-10-20 NOTE — Progress Notes (Signed)
   PRENATAL VISIT NOTE  Subjective:  Kristin Jackson is a 27 y.o. [id number] at [redacted]w[redacted]d being seen today for ongoing prenatal care.  She is currently monitored for the following issues for this high-risk pregnancy and has Language barrier; Gestational diabetes mellitus in pregnancy, unspecified control; Hx of preeclampsia, prior pregnancy, currently pregnant, third trimester; and Supervision of high risk pregnancy, antepartum, third trimester on their problem list.  Patient doing well with no acute concerns today. She reports no complaints.  Contractions: Not present. Vag. Bleeding: None.  Movement: Present. Denies leaking of fluid.   The following portions of the patient's history were reviewed and updated as appropriate: allergies, current medications, past family history, past medical history, past social history, past surgical history and problem list. Problem list updated.  Objective:   Vitals:   10/20/22 0930  BP: 116/75  Pulse: (!) 102  Weight: 160 lb 11.2 oz (72.9 kg)    Fetal Status: Fetal Heart Rate (bpm): 155 Fundal Height: 37 cm Movement: Present     General:  Alert, oriented and cooperative. Patient is in no acute distress.  Skin: Skin is warm and dry. No rash noted.   Cardiovascular: Normal heart rate noted  Respiratory: Normal respiratory effort, no problems with respiration noted  Abdomen: Soft, gravid, appropriate for gestational age.  Pain/Pressure: Absent     Pelvic: Cervical exam deferred        Extremities: Normal range of motion.  Edema: None  Mental Status:  Normal mood and affect. Normal behavior. Normal judgment and thought content.   Assessment and Plan:  Pregnancy: A5W0981 at [redacted]w[redacted]d  1. [redacted] weeks gestation of pregnancy   2. Supervision of high risk pregnancy, antepartum, third trimester Continue routine prenatal care  3. Language barrier Live interpreter present  4. Hx of preeclampsia, prior pregnancy, currently pregnant, third trimester No s/sx of  preeclampsia  5. Diet controlled gestational diabetes mellitus (GDM) in third trimester [facility] chart reviewed, pt is currently A1 GDM She did not bring in blood sugars and compliance is a question. Last EFW was at 15%.  Will schedule patient for IOL at 39 weeks.  Term labor symptoms and general obstetric precautions including but not limited to vaginal bleeding, contractions, leaking of fluid and fetal movement were reviewed in detail with the patient.  Please refer to After Visit Summary for other counseling recommendations.   Return in about 1 week (around 10/27/2022) for [facility], in person. Pt has NST scheduled for this week and next week. IOL scheduled for 10/29/16   Mariel Aloe, MD Faculty Attending Center for [facility]

## 2022-10-21 DIAGNOSIS — O0993 Supervision of high risk pregnancy, unspecified, third trimester: Secondary | ICD-10-CM

## 2022-10-21 LAB — CYTOLOGY - PAP: Pap: NEGATIVE

## 2022-10-22 ENCOUNTER — Ambulatory Visit: Payer: 59 | Admitting: *Deleted

## 2022-10-22 ENCOUNTER — Ambulatory Visit: Payer: 59 | Attending: Maternal & Fetal Medicine | Admitting: *Deleted

## 2022-10-22 VITALS — BP 121/66 | HR 95

## 2022-10-22 DIAGNOSIS — O24415 Gestational diabetes mellitus in pregnancy, controlled by oral hypoglycemic drugs: Secondary | ICD-10-CM | POA: Diagnosis not present

## 2022-10-22 DIAGNOSIS — O24419 Gestational diabetes mellitus in pregnancy, unspecified control: Secondary | ICD-10-CM | POA: Diagnosis present

## 2022-10-22 DIAGNOSIS — Z3A37 37 weeks gestation of pregnancy: Secondary | ICD-10-CM

## 2022-10-22 NOTE — Procedures (Signed)
Kristin Jackson Jun 10, 1995 [redacted]w[redacted]d  Fetus A Non-Stress Test Interpretation for 10/22/22-- NST only  Indication: Gestational Diabetes medication controlled  Fetal Heart Rate A Mode: External Baseline Rate (A): 150 bpm Variability: Moderate Accelerations: 15 x 15 Decelerations: None Multiple birth?: No  Uterine Activity Mode: Toco Contraction Frequency (min): none Resting Tone Palpated: Relaxed  Interpretation (Fetal Testing) Nonstress Test Interpretation: Reactive Comments: Tracing reviewed byDr. Darra Lis

## 2022-10-26 ENCOUNTER — Telehealth (HOSPITAL_COMMUNITY): Payer: Self-pay | Admitting: *Deleted

## 2022-10-26 ENCOUNTER — Encounter (HOSPITAL_COMMUNITY): Payer: Self-pay | Admitting: *Deleted

## 2022-10-26 NOTE — Telephone Encounter (Signed)
Preadmission screenPreadmission screenPreadmission screen Interpreter number [phone]

## 2022-10-28 ENCOUNTER — Other Ambulatory Visit: Payer: Self-pay | Admitting: Advanced Practice Midwife

## 2022-10-28 ENCOUNTER — Encounter: Payer: 59 | Admitting: Family Medicine

## 2022-10-29 ENCOUNTER — Ambulatory Visit: Payer: 59 | Admitting: *Deleted

## 2022-10-29 ENCOUNTER — Encounter: Payer: 59 | Admitting: Family Medicine

## 2022-10-29 VITALS — BP 124/65 | HR 89

## 2022-10-29 DIAGNOSIS — O24415 Gestational diabetes mellitus in pregnancy, controlled by oral hypoglycemic drugs: Secondary | ICD-10-CM | POA: Insufficient documentation

## 2022-10-29 DIAGNOSIS — O09299 Supervision of pregnancy with other poor reproductive or obstetric history, unspecified trimester: Secondary | ICD-10-CM | POA: Diagnosis not present

## 2022-10-29 DIAGNOSIS — O24419 Gestational diabetes mellitus in pregnancy, unspecified control: Secondary | ICD-10-CM

## 2022-10-29 DIAGNOSIS — O24410 Gestational diabetes mellitus in pregnancy, diet controlled: Secondary | ICD-10-CM

## 2022-10-29 DIAGNOSIS — Z3A38 38 weeks gestation of pregnancy: Secondary | ICD-10-CM | POA: Insufficient documentation

## 2022-10-29 DIAGNOSIS — O09293 Supervision of pregnancy with other poor reproductive or obstetric history, third trimester: Secondary | ICD-10-CM | POA: Insufficient documentation

## 2022-10-29 NOTE — Procedures (Signed)
Kristin Jackson 1995-06-08 [redacted]w[redacted]d  Fetus A Non-Stress Test Interpretation for 10/29/22-NST only  Indication: Gestational Diabetes medication controlled  Fetal Heart Rate A Mode: External Baseline Rate (A): 145 bpm Variability: Moderate, Minimal Accelerations: 15 x 15 Decelerations: None Multiple birth?: No  Uterine Activity Mode: Toco Contraction Frequency (min): occas Contraction Duration (sec): 50-120 Contraction Quality: Mild Resting Tone Palpated: Relaxed  Interpretation (Fetal Testing) Nonstress Test Interpretation: Reactive Comments: Tracing reviewed by Dr. Parke Poisson

## 2022-10-30 ENCOUNTER — Encounter (HOSPITAL_COMMUNITY): Payer: Self-pay | Admitting: Obstetrics and Gynecology

## 2022-10-30 ENCOUNTER — Inpatient Hospital Stay (HOSPITAL_COMMUNITY): Payer: 59

## 2022-10-30 ENCOUNTER — Inpatient Hospital Stay (HOSPITAL_COMMUNITY)
Admission: RE | Admit: 2022-10-30 | Discharge: 2022-11-01 | DRG: 807 | Disposition: A | Discharge status: Home / Self Care | Payer: 59 | Attending: Family Medicine | Admitting: Family Medicine

## 2022-10-30 ENCOUNTER — Other Ambulatory Visit: Payer: Self-pay

## 2022-10-30 DIAGNOSIS — O24420 Gestational diabetes mellitus in childbirth, diet controlled: Principal | ICD-10-CM | POA: Diagnosis present

## 2022-10-30 DIAGNOSIS — O24410 Gestational diabetes mellitus in pregnancy, diet controlled: Principal | ICD-10-CM | POA: Diagnosis present

## 2022-10-30 DIAGNOSIS — Z3A39 39 weeks gestation of pregnancy: Secondary | ICD-10-CM

## 2022-10-30 DIAGNOSIS — Z3490 Encounter for supervision of normal pregnancy, unspecified, unspecified trimester: Secondary | ICD-10-CM

## 2022-10-30 LAB — GLUCOSE, CAPILLARY
Glucose-Capillary: 82 mg/dL (ref 70–99)
Glucose-Capillary: 59 mg/dL — ABNORMAL LOW (ref 70–99)
Glucose-Capillary: 68 mg/dL — ABNORMAL LOW (ref 70–99)
Glucose-Capillary: 71 mg/dL (ref 70–99)

## 2022-10-30 LAB — TYPE AND SCREEN
ABO/RH(D): O POS
Antibody Screen: NEGATIVE

## 2022-10-30 LAB — CBC
HCT: 32 % — ABNORMAL LOW (ref 36.0–46.0)
Hemoglobin: 10.4 g/dL — ABNORMAL LOW (ref 12.0–15.0)
MCH: 26.6 pg (ref 26.0–34.0)
MCHC: 32.5 g/dL (ref 30.0–36.0)
MCV: 81.8 fL (ref 80.0–100.0)
Platelets: 206 10*3/uL (ref 150–400)
RBC: 3.91 MIL/uL (ref 3.87–5.11)
RDW: 13.4 % (ref 11.5–15.5)
WBC: 6.8 10*3/uL (ref 4.0–10.5)
nRBC: 0 % (ref 0.0–0.2)

## 2022-10-30 LAB — COMPREHENSIVE METABOLIC PANEL
ALT: 10 U/L (ref 0–44)
AST: 18 U/L (ref 15–41)
Albumin: 2.4 g/dL — ABNORMAL LOW (ref 3.5–5.0)
Alkaline Phosphatase: 89 U/L (ref 38–126)
Anion gap: 10 (ref 5–15)
BUN: <5 mg/dL — ABNORMAL LOW (ref 6–20)
CO2: 18 mmol/L — ABNORMAL LOW (ref 22–32)
Calcium: 8.2 mg/dL — ABNORMAL LOW (ref 8.9–10.3)
Chloride: 110 mmol/L (ref 98–111)
Creatinine, Ser: 0.39 mg/dL — ABNORMAL LOW (ref 0.44–1.00)
GFR, Estimated: >60 mL/min (ref >60)
Glucose, Bld: 91 mg/dL (ref 70–99)
Potassium: 3.4 mmol/L — ABNORMAL LOW (ref 3.5–5.1)
Sodium: 138 mmol/L (ref 135–145)
Total Bilirubin: 0.4 mg/dL (ref 0.3–1.2)
Total Protein: 5.4 g/dL — ABNORMAL LOW (ref 6.5–8.1)

## 2022-10-30 LAB — RPR: RPR Ser Ql: NONREACTIVE

## 2022-10-30 MED ORDER — SENNOSIDES-DOCUSATE SODIUM 8.6-50 MG PO TABS
2.0000 | ORAL_TABLET | Freq: Every day | ORAL | Status: DC
  Administered 2022-10-31 – 2022-11-01 (×2): 2 via ORAL
  Filled 2022-10-30 (×2): qty 2

## 2022-10-30 MED ORDER — TETANUS-DIPHTH-ACELL PERTUSSIS 5-2.5-18.5 LF-MCG/0.5 IM SUSY: 0.5000 mL | PREFILLED_SYRINGE | Freq: Once | INTRAMUSCULAR | Status: DC

## 2022-10-30 MED ORDER — DIBUCAINE (PERIANAL) 1 % EX OINT: 1.0000 | TOPICAL_OINTMENT | CUTANEOUS | Status: DC | PRN

## 2022-10-30 MED ORDER — SOD CITRATE-CITRIC ACID 500-334 MG/5ML PO SOLN: 30.0000 mL | ORAL | Status: DC | PRN

## 2022-10-30 MED ORDER — ACETAMINOPHEN 325 MG PO TABS: 650.0000 mg | ORAL_TABLET | ORAL | Status: DC | PRN

## 2022-10-30 MED ORDER — OXYCODONE-ACETAMINOPHEN 5-325 MG PO TABS: 1.0000 | ORAL_TABLET | ORAL | Status: DC | PRN

## 2022-10-30 MED ORDER — COCONUT OIL OIL: 1.0000 | TOPICAL_OIL | Status: DC | PRN

## 2022-10-30 MED ORDER — LACTATED RINGERS IV SOLN: INTRAVENOUS | Status: DC

## 2022-10-30 MED ORDER — OXYTOCIN-SODIUM CHLORIDE 30-0.9 UT/500ML-% IV SOLN
1.0000 m[IU]/min | INTRAVENOUS | Status: DC
  Administered 2022-10-30: 2 m[IU]/min via INTRAVENOUS
  Filled 2022-10-30: qty 500

## 2022-10-30 MED ORDER — FENTANYL CITRATE (PF) 100 MCG/2ML IJ SOLN
50.0000 ug | INTRAMUSCULAR | Status: DC | PRN
  Administered 2022-10-30 (×3): 100 ug via INTRAVENOUS
  Filled 2022-10-30 (×3): qty 2

## 2022-10-30 MED ORDER — OXYTOCIN-SODIUM CHLORIDE 30-0.9 UT/500ML-% IV SOLN
2.5000 [IU]/h | INTRAVENOUS | Status: DC
  Administered 2022-10-30: 2.5 [IU]/h via INTRAVENOUS

## 2022-10-30 MED ORDER — OXYTOCIN BOLUS FROM INFUSION
333.0000 mL | Freq: Once | INTRAVENOUS | Status: AC
  Administered 2022-10-30: 333 mL via INTRAVENOUS

## 2022-10-30 MED ORDER — TERBUTALINE SULFATE 1 MG/ML IJ SOLN: 0.2500 mg | Freq: Once | INTRAMUSCULAR | Status: DC | PRN

## 2022-10-30 MED ORDER — LACTATED RINGERS IV SOLN: 500.0000 mL | INTRAVENOUS | Status: DC | PRN

## 2022-10-30 MED ORDER — ONDANSETRON HCL 4 MG/2ML IJ SOLN: 4.0000 mg | INTRAMUSCULAR | Status: DC | PRN

## 2022-10-30 MED ORDER — DIPHENHYDRAMINE HCL 25 MG PO CAPS: 25.0000 mg | ORAL_CAPSULE | Freq: Four times a day (QID) | ORAL | Status: DC | PRN

## 2022-10-30 MED ORDER — IBUPROFEN 600 MG PO TABS
600.0000 mg | ORAL_TABLET | Freq: Four times a day (QID) | ORAL | Status: DC
  Administered 2022-10-31 – 2022-11-01 (×7): 600 mg via ORAL
  Filled 2022-10-30 (×7): qty 1

## 2022-10-30 MED ORDER — LIDOCAINE HCL (PF) 1 % IJ SOLN
30.0000 mL | INTRAMUSCULAR | Status: AC | PRN
  Administered 2022-10-30: 30 mL via SUBCUTANEOUS
  Filled 2022-10-30: qty 30

## 2022-10-30 MED ORDER — SIMETHICONE 80 MG PO CHEW: 80.0000 mg | CHEWABLE_TABLET | ORAL | Status: DC | PRN

## 2022-10-30 MED ORDER — PRENATAL MULTIVITAMIN CH
1.0000 | ORAL_TABLET | Freq: Every day | ORAL | Status: DC
  Administered 2022-10-31 – 2022-11-01 (×2): 1 via ORAL
  Filled 2022-10-30 (×2): qty 1

## 2022-10-30 MED ORDER — WITCH HAZEL-GLYCERIN EX PADS: 1.0000 | MEDICATED_PAD | CUTANEOUS | Status: DC | PRN

## 2022-10-30 MED ORDER — ONDANSETRON HCL 4 MG PO TABS: 4.0000 mg | ORAL_TABLET | ORAL | Status: DC | PRN

## 2022-10-30 MED ORDER — MISOPROSTOL 50MCG HALF TABLET
50.0000 ug | ORAL_TABLET | Freq: Once | ORAL | Status: AC
  Administered 2022-10-30: 50 ug via ORAL
  Filled 2022-10-30: qty 1

## 2022-10-30 MED ORDER — BENZOCAINE-MENTHOL 20-0.5 % EX AERO
1.0000 | INHALATION_SPRAY | CUTANEOUS | Status: DC | PRN
  Administered 2022-10-31: 1 via TOPICAL
  Filled 2022-10-30: qty 56

## 2022-10-30 MED ORDER — MISOPROSTOL 25 MCG QUARTER TABLET
25.0000 ug | ORAL_TABLET | Freq: Once | ORAL | Status: AC
  Administered 2022-10-30: 25 ug via VAGINAL
  Filled 2022-10-30: qty 1

## 2022-10-30 MED ORDER — ONDANSETRON HCL 4 MG/2ML IJ SOLN: 4.0000 mg | Freq: Four times a day (QID) | INTRAMUSCULAR | Status: DC | PRN

## 2022-10-30 NOTE — H&P (Signed)
OBSTETRIC ADMISSION HISTORY AND PHYSICAL  Kristin Jackson is a 27 y.o. female [phone] with IUP at [redacted]w[redacted]d by LMP presenting for IOL for GDM. She reports +FMs, No LOF, no VB, no blurry vision, headaches or peripheral edema, and RUQ pain.  She plans on breast feeding. She request depo for birth control. She received her prenatal care at [facility]   Dating: By LMP --->  Estimated Date of Delivery: 11/06/22  Sono:    @[redacted]w[redacted]d , CWD, normal anatomy, vertex presentation, 2423g, 15% EFW   Prenatal History/Complications:   Past Medical History: Past Medical History:  Diagnosis Date   Gestational diabetes    Spontaneous abortion with septicemia 03/15/2017    Past Surgical History: Past Surgical History:  Procedure Laterality Date   DILATION AND CURETTAGE OF UTERUS N/A 03/15/2017   Procedure: DILATATION AND CURETTAGE (Suction D & C with Ultrasound);  Surgeon: Radom Bing, MD;  Location: [facility];  Service: Gynecology;  Laterality: N/A;    Obstetrical History: OB History     Gravida  4   Para  2   Term  2   Preterm  0   AB  1   Living  2      SAB  1   IAB  0   Ectopic  0   Multiple  0   Live Births  2           Social History Social History   Socioeconomic History   Marital status: Single    Spouse name: Not on file   Number of children: Not on file   Years of education: Not on file   Highest education level: Not on file  Occupational History   Not on file  Tobacco Use   Smoking status: Never   Smokeless tobacco: Never  Vaping Use   Vaping status: Never Used  Substance and Sexual Activity   Alcohol use: No   Drug use: No   Sexual activity: Not Currently  Other Topics Concern   Not on file  Social History Narrative   Not on file   Social Determinants of Health   Financial Resource Strain: Not on file  Food Insecurity: No Food Insecurity (10/30/2022)   Hunger Vital Sign    Worried About Running Out of Food in the Last Year: Never true    Ran Out of  Food in the Last Year: Never true  Transportation Needs: No Transportation Needs (10/30/2022)   PRAPARE - Administrator, Civil Service (Medical): No    Lack of Transportation (Non-Medical): No  Physical Activity: Not on file  Stress: Not on file  Social Connections: Not on file    Family History: Family History  Problem Relation Age of Onset   Diabetes Neg Hx    Hypertension Neg Hx     Allergies: No Known Allergies  Medications Prior to Admission  Medication Sig Dispense Refill Last Dose   Accu-Chek Softclix Lancets lancets Use as instructed 100 each 12    Blood Glucose Monitoring Suppl (ACCU-CHEK GUIDE) w/Device KIT 1 each by Does not apply route 4 (four) times daily. 1 kit 0    ferrous sulfate 325 (65 FE) MG EC tablet Take 325 mg by mouth 3 (three) times daily with meals.      glucose blood test strip Use as instructed 100 each 12    Prenatal Vit-Fe Fumarate-FA (MULTIVITAMIN-PRENATAL) 27-0.8 MG TABS tablet Take 1 tablet by mouth daily at 12 noon.  Review of Systems   All systems reviewed and negative except as stated in HPI  Blood pressure 131/72, pulse 81, temperature 99.4 F (37.4 C), temperature source Oral, resp. rate 18, height 5\' 1"  (1.549 m), weight 76.1 kg, last menstrual period 01/30/2022, unknown if currently breastfeeding. General appearance: alert, no distress, and tearful Lungs: clear to auscultation bilaterally Heart: regular rate and rhythm Abdomen: soft, non-tender; bowel sounds normal Pelvic:  Extremities: Homans sign is negative, no sign of DVT DTR's +2 Presentation: cephalic Fetal monitoringBaseline: 135 bpm, Variability: Good {> 6 bpm), Accelerations: Reactive, and Decelerations: Absent Uterine activityFrequency: Every irregular Duration: 60-120 seconds, and Intensity: mild Dilation: 1.5 Effacement (%): 80 Station: -3 Exam by:: Lamont Snowball, CNM   Prenatal labs: ABO, Rh: --/--/O POS (10/11 0855) Antibody: NEG (10/11  0855) Rubella: Immune (09/05 0000) RPR: Nonreactive (09/05 0000)  HBsAg: Positive (09/05 0000)  HIV: Non-reactive (09/05 0000)  GBS: Negative/-- (09/20 0000)  1 hr Glucola A1GDM Genetic screening  Low risk  Anatomy US WNL  Prenatal Transfer Tool  Maternal Diabetes: Yes:  Diabetes Type:  Diet controlled Genetic Screening: Normal Maternal Ultrasounds/Referrals: Normal Fetal Ultrasounds or other Referrals:  None Maternal Substance Abuse:  No Significant Maternal Medications:  None Significant Maternal Lab Results:  None Number of Prenatal Visits:greater than 3 verified prenatal visits Other Comments:  None  Results for orders placed or performed during the hospital encounter of 10/30/22 (from the past 24 hour(s))  Type and screen   Collection Time: 10/30/22  8:55 AM  Result Value Ref Range   ABO/RH(D) O POS    Antibody Screen NEG    Sample Expiration      11/02/2022,2359 Performed at [facility], [address], [place], [place] [address]   CBC   Collection Time: 10/30/22  8:58 AM  Result Value Ref Range   WBC 6.8 4.0 - 10.5 K/uL   RBC 3.91 3.87 - 5.11 MIL/uL   Hemoglobin 10.4 (L) 12.0 - 15.0 g/dL   HCT 60.4 (L) 54.0 - 98.1 %   MCV 81.8 80.0 - 100.0 fL   MCH 26.6 26.0 - 34.0 pg   MCHC 32.5 30.0 - 36.0 g/dL   RDW 19.1 47.8 - 29.5 %   Platelets 206 150 - 400 K/uL   nRBC 0.0 0.0 - 0.2 %  Comprehensive metabolic panel   Collection Time: 10/30/22  8:58 AM  Result Value Ref Range   Sodium 138 135 - 145 mmol/L   Potassium 3.4 (L) 3.5 - 5.1 mmol/L   Chloride 110 98 - 111 mmol/L   CO2 18 (L) 22 - 32 mmol/L   Glucose, Bld 91 70 - 99 mg/dL   BUN <5 (L) 6 - 20 mg/dL   Creatinine, Ser 6.21 (L) 0.44 - 1.00 mg/dL   Calcium 8.2 (L) 8.9 - 10.3 mg/dL   Total Protein 5.4 (L) 6.5 - 8.1 g/dL   Albumin 2.4 (L) 3.5 - 5.0 g/dL   AST 18 15 - 41 U/L   ALT 10 0 - 44 U/L   Alkaline Phosphatase 89 38 - 126 U/L   Total Bilirubin 0.4 0.3 - 1.2 mg/dL   GFR, Estimated >30 >86 mL/min    Anion gap 10 5 - 15  Glucose, capillary   Collection Time: 10/30/22  9:42 AM  Result Value Ref Range   Glucose-Capillary 82 70 - 99 mg/dL  Glucose, capillary   Collection Time: 10/30/22  1:49 PM  Result Value Ref Range   Glucose-Capillary 68 (L)  70 - 99 mg/dL  Glucose, capillary   Collection Time: 10/30/22  2:16 PM  Result Value Ref Range   Glucose-Capillary 59 (L) 70 - 99 mg/dL    Patient Active Problem List   Diagnosis Date Noted   Diet controlled gestational diabetes mellitus (GDM) in third trimester 10/30/2022   Supervision of high risk pregnancy, antepartum, third trimester 10/08/2022   Gestational diabetes mellitus in pregnancy, controlled by oral hypoglycemic drugs 10/06/2022   Hx of preeclampsia, prior pregnancy, currently pregnant, third trimester 10/06/2022   Language barrier 03/15/2017    Assessment/Plan:  Sascha Palma is a 27 y.o. [id number] at [redacted]w[redacted]d here for IOL for GDM.   #Labor:IOL for GDM in latent phase of labor. Dual route cytotec PO/68mcg PV administered. Consider foley balloon vs. Cytotec vs. AROM for augmentation.  #Pain: Coping well. Epidural or IV pain medication on request.  #FWB: Cat 1 #ID:  GBS negative #MOF: Breast #MOC:Depo #Circ:  NA  Richardson Landry, CNM  10/30/2022, 3:25 PM

## 2022-10-30 NOTE — Lactation Note (Signed)
This note was copied from a baby's chart. Lactation Consultation Note  Patient Name: Kristin Jackson ZOXWR'U Date: 10/30/2022 Age:26 hours Reason for consult: Initial assessment;Term.  Swahili interpreter used Abdi [phone]  P3, MOB latched infant on her right breast using the football hold, infant was still BF after 10 minutes when LC left the room. Infant was removed from breast few times due to a  shallow latch. LC worked on positioning and latch position with MOB. MOB will continue to BF infant by cues, every 2 -3 hours, skin to skin. MOB knows to ask for latch assistance if needed. LC discussed the importance of maternal rest, diet and hydration. MOB would like hand pump at hospital discharge. MOB was made aware of O/P services, breastfeeding support groups, community resources, and our phone # for post-discharge questions.    Maternal Data Does the patient have breastfeeding experience prior to this delivery?: Yes How long did the patient breastfeed?: MOB BF other two children for 18 months each  Feeding Mother's Current Feeding Choice: Breast Milk  LATCH Score Latch: Grasps breast easily, tongue down, lips flanged, rhythmical sucking.  Audible Swallowing: A few with stimulation  Type of Nipple: Everted at rest and after stimulation  Comfort (Breast/Nipple): Soft / non-tender  Hold (Positioning): Assistance needed to correctly position infant at breast and maintain latch.  LATCH Score: 8   Lactation Tools Discussed/Used    Interventions Interventions: Breast feeding basics reviewed;Assisted with latch;Skin to skin;Breast compression;Adjust position;Support pillows;Position options;Education;LC Services brochure  Discharge    Consult Status Consult Status: Follow-up Date: 10/31/22 Follow-up type: In-patient    Frederico Hamman 10/30/2022, 11:56 PM

## 2022-10-30 NOTE — Progress Notes (Signed)
Labor Progress Note Kristin Jackson is a 27 y.o. [id number] at [redacted]w[redacted]d presented for IOL for A1GDM.   S:  Patient remains tearful and withdrawn. Support by bedside nursing staff, no other support present.   O:  BP 136/69   Pulse 76   Temp 98.3 F (36.8 C) (Oral)   Resp 18   Ht 5\' 1"  (1.549 m)   Wt 76.1 kg   LMP 01/30/2022   BMI 31.71 kg/m  EFM: baseline 145 bpm/ moderate, currently minimal variability/ 15x15 accels/ absent decels  Toco/IUPC: 1-2, 60-90 secs SVE: Dilation: 1.5 Effacement (%): 80 Cervical Position: Posterior Station: -3 Presentation: Vertex Exam by:: Lamont Snowball, CNM Pitocin: 2 mu/min  A/P: 27 y.o. A5W0981 [redacted]w[redacted]d  1. Labor: IOL for A1GDM, currently in latent phase. Cytotec dual dose previously. Patient continues to decline foley balloon, pitocin at 2x2. Currently declines SVE.  2. FWB: Cat 1 3. Pain: Coaching necessary to cope with labor pain. Epidural offered and counseled on, declines. IV pain medication available on request.  4. GDM: CBGs q4hrs until active phase, then q2hrs.   Interpreter used for entire duration of time in room.   Anticipate NVSB.  Richardson Landry, CNM 5:19 PM

## 2022-10-30 NOTE — Discharge Summary (Signed)
Postpartum Discharge Summary      Patient Name: Kristin Jackson DOB: 09-14-1995 MRN: [id number]  Date of admission: 10/30/2022 Delivery date:10/30/2022 Delivering provider: Lamont Snowball A Date of discharge: 11/01/2022  Admitting diagnosis: Diet controlled gestational diabetes mellitus (GDM) in third trimester [O24.410] Intrauterine pregnancy: [redacted]w[redacted]d     Secondary diagnosis:  Principal Problem:   Diet controlled gestational diabetes mellitus (GDM) in third trimester  Additional problems: none    Discharge diagnosis: Term Pregnancy Delivered and GDM A1                                              Post partum procedures: none Augmentation: Pitocin and Cytotec Complications: None  Hospital course: Induction of Labor With Vaginal Delivery   27 y.o. yo [phone] at [redacted]w[redacted]d was admitted to the hospital 10/30/2022 for induction of labor.  Indication for induction: A1 DM.  Patient had an labor course complicated bynone Membrane Rupture Time/Date: 8:07 PM,10/30/2022  Delivery Method:Vaginal, Spontaneous Operative Delivery:N/A Episiotomy: None Lacerations:  1st degree;Perineal Details of delivery can be found in separate delivery note.  Patient had a postpartum course complicated by nothing. Patient is discharged home 11/01/22.  Newborn Data: Birth date:10/30/2022 Birth time:8:07 PM Gender:Female Living status:Living Apgars:8 ,9  Weight:2950 g  Magnesium Sulfate received: No BMZ received: No Rhophylac:N/A MMR:N/A T-DaP:Given prenatally Flu: N/A RSV Vaccine received: No Transfusion:No  Immunizations received: There is no immunization history for the selected administration types on file for this patient.  Physical exam  Vitals:   10/31/22 0818 10/31/22 1220 10/31/22 2128 11/01/22 0541  BP: (!) 112/55 124/64 126/64 127/74  Pulse: 72 84 83 81  Resp: 18 18 18 20   Temp: 97.7 F (36.5 C) 98 F (36.7 C) 98.1 F (36.7 C) 98.5 F (36.9 C)  TempSrc: Oral Oral Oral Oral   SpO2: 98%  99% 99%  Weight:      Height:       General: alert, cooperative, and no distress Lochia: appropriate Uterine Fundus: firm DVT Evaluation: No evidence of DVT seen on physical exam. Labs: Lab Results  Component Value Date   WBC 12.7 (H) 10/31/2022   HGB 10.0 (L) 10/31/2022   HCT 30.6 (L) 10/31/2022   MCV 79.1 (L) 10/31/2022   PLT 221 10/31/2022      Latest Ref Rng & Units 10/30/2022    8:58 AM  CMP  Glucose 70 - 99 mg/dL 91   BUN 6 - 20 mg/dL <5   Creatinine 6.64 - 1.00 mg/dL 4.03   Sodium 474 - 259 mmol/L 138   Potassium 3.5 - 5.1 mmol/L 3.4   Chloride 98 - 111 mmol/L 110   CO2 22 - 32 mmol/L 18   Calcium 8.9 - 10.3 mg/dL 8.2   Total Protein 6.5 - 8.1 g/dL 5.4   Total Bilirubin 0.3 - 1.2 mg/dL 0.4   Alkaline Phos 38 - 126 U/L 89   AST 15 - 41 U/L 18   ALT 0 - 44 U/L 10    Edinburgh Score:    10/31/2022    3:49 PM  Edinburgh Postnatal Depression Scale Screening Tool  I have been able to laugh and see the funny side of things. 0  I have looked forward with enjoyment to things. 0  I have blamed myself unnecessarily when things went wrong. 0  I have been anxious or worried  for no good reason. 0  I have felt scared or panicky for no good reason. 0  Things have been getting on top of me. 0  I have been so unhappy that I have had difficulty sleeping. 0  I have felt sad or miserable. 0  I have been so unhappy that I have been crying. 0  The thought of harming myself has occurred to me. 0  Edinburgh Postnatal Depression Scale Total 0   Edinburgh Postnatal Depression Scale Total: 0   After visit meds:  Allergies as of 11/01/2022   No Known Allergies      Medication List     TAKE these medications    Accu-Chek Guide w/Device Kit 1 each by Does not apply route 4 (four) times daily.   Accu-Chek Softclix Lancets lancets Use as instructed   ferrous sulfate 325 (65 FE) MG EC tablet Take 325 mg by mouth 3 (three) times daily with meals.   glucose  blood test strip Use as instructed   ibuprofen 600 MG tablet Commonly known as: ADVIL Take 1 tablet (600 mg total) by mouth every 6 (six) hours.   multivitamin-prenatal 27-0.8 MG Tabs tablet Take 1 tablet by mouth daily at 12 noon.         Discharge home in stable condition Infant Feeding: Breast Infant Disposition:home with mother Discharge instruction: per After Visit Summary and Postpartum booklet. Activity: Advance as tolerated. Pelvic rest for 6 weeks.  Diet: routine diet Future Appointments:No future appointments. Follow up Visit:  Follow-up Information     [facility] Follow up in 4 week(s).   Specialty: Obstetrics and Gynecology Why: pp check, they will call you with an appointment Contact information: [address] [place] [address] [phone]                 Please schedule this patient for a In person postpartum visit in 6 weeks with the following provider: Any provider. Additional Postpartum F/U: none   High risk pregnancy complicated by: GDM Delivery mode:  Vaginal, Spontaneous Anticipated Birth Control:  Depo   11/01/2022 Reva Bores, MD

## 2022-10-30 NOTE — Progress Notes (Signed)
Labor Progress Note Rickie Mayol is a 27 y.o. [id number] at [redacted]w[redacted]d presented for IOL due to GDM status   S:  Patient remains tearful, denies needs or assistance from CNM or other staff. No support at bedside. Continues to desire to avoid foley balloon placement.   O:  BP (!) 128/56   Pulse 83   Temp 99.4 F (37.4 C) (Oral)   Resp 18   Ht 5\' 1"  (1.549 m)   Wt 76.1 kg   LMP 01/30/2022   BMI 31.71 kg/m  EFM: baseline 145 bpm/ minimal to moderate variability/ 15x15 accels accels/ absent decels  Toco/IUPC: 1-2 minutes,  moderate SVE: Dilation: 1.5 Effacement (%): 80 Cervical Position: Posterior Station: -3 Presentation: Vertex Exam by:: Lamont Snowball, CNM Pitocin: 0 mu/min  A/P: 27 y.o. [id number] [redacted]w[redacted]d  1. Labor: IOL in latent phase s/p cytotec. Continues to refuse foley balloon. Consider returning to discussion of foley balloon at a later time. Cytotec administration not yet appropriate due to tachysystolic contraction pattern. Start pitocin 2x2.  2. FWB: Cat 1  3. Pain: Coping well with minimal cramping.  4. GDM: CBGs q 4 hrs until active labor. CBGs 82, 68.    Anticipate NVSB.  Richardson Landry, CNM 2:02 PM

## 2022-10-30 NOTE — Progress Notes (Signed)
Labor Progress Note Kristin Jackson is a 27 y.o. Z6X0960 at [redacted]w[redacted]d presented for IOL due to GDM status.   S:  Patient tearful, not ready to disclose reasons for emotional status. Reports having a support person to join her later today. At this time, patient states she wishes to avoid foley balloon.   O:  BP 137/71   Pulse 97   Temp 99 F (37.2 C) (Oral)   Resp 18   Ht 5\' 1"  (1.549 m)   Wt 76.1 kg   LMP 01/30/2022   BMI 31.71 kg/m  EFM: baseline 145 bpm/ moderate variability/ 15x15 accels/ absent decels  Toco/IUPC: 4-5 SVE: Dilation: 1 Effacement (%): 80 Cervical Position: Posterior Presentation: Vertex Exam by:: Lamont Snowball, CNM Pitocin: 0 mu/min  A/P: 27 y.o. A5W0981 [redacted]w[redacted]d  1. Labor: IOL due to GDM status. Cytotec PO, PV. Consider foley balloon vs. Repeat cytotec vs. AROM in 4 hours from cytotec administration.  2. FWB: Cat 1 3. Pain: Coping well with minimal pain.  4. GDM: CBGs q4 hours now until active labor.   Anticipate NVSB.  In person interpreter used for duration of time in room, discussion of POC and RBA of foley balloon.  Richardson Landry, CNM 9:35 AM

## 2022-10-31 LAB — CBC
HCT: 30.6 % — ABNORMAL LOW (ref 36.0–46.0)
Hemoglobin: 10 g/dL — ABNORMAL LOW (ref 12.0–15.0)
MCH: 25.8 pg — ABNORMAL LOW (ref 26.0–34.0)
MCHC: 32.7 g/dL (ref 30.0–36.0)
MCV: 79.1 fL — ABNORMAL LOW (ref 80.0–100.0)
Platelets: 221 K/uL (ref 150–400)
RBC: 3.87 MIL/uL (ref 3.87–5.11)
RDW: 13.3 % (ref 11.5–15.5)
WBC: 12.7 K/uL — ABNORMAL HIGH (ref 4.0–10.5)
nRBC: 0 % (ref 0.0–0.2)

## 2022-10-31 LAB — GLUCOSE, CAPILLARY
Glucose-Capillary: 108 mg/dL — ABNORMAL HIGH (ref 70–99)
Glucose-Capillary: 64 mg/dL — ABNORMAL LOW (ref 70–99)

## 2022-10-31 MED ORDER — MEDROXYPROGESTERONE ACETATE 150 MG/ML IM SUSP
150.0000 mg | INTRAMUSCULAR | Status: DC
  Administered 2022-10-31: 150 mg via INTRAMUSCULAR
  Filled 2022-10-31: qty 1

## 2022-10-31 NOTE — Lactation Note (Signed)
This note was copied from a baby's chart. Lactation Consultation Note  Patient Name: Kristin Jackson ZOXWR'U Date: 10/31/2022 Age:26 hours    Attempted to visit with parent. Family was in the room doing a ceremony and singing for baby. Interpreter services were unavailable.  Feeding Nipple Type: Slow - flow    [name] 10/31/2022, 1:21 PM

## 2022-10-31 NOTE — Progress Notes (Signed)
Post Partum Day 1 Subjective: no complaints, up ad lib, voiding, and tolerating PO  Objective: Blood pressure 120/61, pulse 78, temperature 98.3 F (36.8 C), temperature source Oral, resp. rate 18, height 5\' 1"  (1.549 m), weight 76.1 kg, last menstrual period 01/30/2022, SpO2 100%, unknown if currently breastfeeding.  Physical Exam:  General: alert, cooperative, and appears stated age Lochia: appropriate Uterine Fundus: firm DVT Evaluation: No evidence of DVT seen on physical exam.  Recent Labs    10/30/22 0858 10/31/22 0510  HGB 10.4* 10.0*  HCT 32.0* 30.6*    Assessment/Plan: Plan for discharge tomorrow, Breastfeeding, and Contraception depo   LOS: 1 day   Reva Bores, MD 10/31/2022, 7:54 AM

## 2022-10-31 NOTE — Progress Notes (Signed)
Date and time results received: 10/31/22 0831   Test: Fasting Glucose Critical Value: 64   Orders Received? Or Actions Taken?: Actions Taken: provided juice and crackers. Rechecked in 15 minutes, blood sugar resulted 108. Ordered breakfast for pt.  Zerita Boers, RN, BSN 10/31/2022 9:09 AM

## 2022-11-01 MED ORDER — IBUPROFEN 600 MG PO TABS: 600.0000 mg | ORAL_TABLET | Freq: Four times a day (QID) | ORAL | 0 refills | Status: AC

## 2022-11-02 NOTE — Progress Notes (Signed)
Encounter opened in error.   Maureen Ralphs RN on 11/05/22 at [phone]

## 2022-11-04 LAB — SURGICAL PATHOLOGY

## 2022-11-24 ENCOUNTER — Telehealth (HOSPITAL_COMMUNITY): Payer: Self-pay | Admitting: *Deleted

## 2022-11-24 NOTE — Telephone Encounter (Signed)
11/24/2022  Name: Kristin Jackson MRN: [id number] DOB: [birth year]  Reason for Call:  Transition of Care Hospital Discharge Call  Contact Status: Patient Contact Status: Message  Language assistant needed: Interpreter Mode: Telephonic Interpreter Interpreter Name: Jerrye Noble [phone]        Follow-Up Questions:    Inocente Salles Postnatal Depression Scale:  In the Past 7 Days:    PHQ2-9 Depression Scale:     Discharge Follow-up:    Post-discharge interventions: NA  Salena Saner, RN 11/24/2022 15:14

## 2022-12-14 ENCOUNTER — Ambulatory Visit: Payer: 59 | Admitting: Family Medicine

## 2022-12-14 ENCOUNTER — Telehealth: Payer: Self-pay | Admitting: Family Medicine

## 2022-12-14 NOTE — Telephone Encounter (Signed)
Called with interpreter to reschedule appt but patient hung up and did not answer

## 2023-11-27 ENCOUNTER — Emergency Department (HOSPITAL_COMMUNITY)
Admission: EM | Admit: 2023-11-27 | Discharge: 2023-11-28 | Disposition: A | Discharge status: Home / Self Care | Attending: Emergency Medicine | Admitting: Emergency Medicine

## 2023-11-27 ENCOUNTER — Emergency Department (HOSPITAL_COMMUNITY)

## 2023-11-27 ENCOUNTER — Encounter (HOSPITAL_COMMUNITY): Payer: Self-pay

## 2023-11-27 ENCOUNTER — Other Ambulatory Visit: Payer: Self-pay

## 2023-11-27 DIAGNOSIS — Y92410 Unspecified street and highway as the place of occurrence of the external cause: Secondary | ICD-10-CM | POA: Insufficient documentation

## 2023-11-27 DIAGNOSIS — M25512 Pain in left shoulder: Secondary | ICD-10-CM | POA: Diagnosis present

## 2023-11-27 DIAGNOSIS — M542 Cervicalgia: Secondary | ICD-10-CM | POA: Diagnosis not present

## 2023-11-27 MED ORDER — ACETAMINOPHEN 500 MG PO TABS
1000.0000 mg | ORAL_TABLET | Freq: Once | ORAL | Status: AC
  Administered 2023-11-27: 1000 mg via ORAL
  Filled 2023-11-27: qty 2
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
## 2023-11-28 ENCOUNTER — Emergency Department (HOSPITAL_COMMUNITY)
